# Patient Record
Sex: Female | Born: 1960 | Race: White | Hispanic: No | Marital: Single | Smoking: Former smoker
Health system: Southern US, Community
[De-identification: ages and names within clinical notes are randomized; demographics above are authoritative.]

## PROBLEM LIST (undated history)

## (undated) DIAGNOSIS — M549 Dorsalgia, unspecified: Secondary | ICD-10-CM

## (undated) DIAGNOSIS — Z860100 Personal history of colon polyps, unspecified: Secondary | ICD-10-CM

## (undated) DIAGNOSIS — M7540 Impingement syndrome of unspecified shoulder: Secondary | ICD-10-CM

## (undated) DIAGNOSIS — E785 Hyperlipidemia, unspecified: Secondary | ICD-10-CM

## (undated) DIAGNOSIS — G4700 Insomnia, unspecified: Secondary | ICD-10-CM

## (undated) DIAGNOSIS — L309 Dermatitis, unspecified: Secondary | ICD-10-CM

## (undated) DIAGNOSIS — J449 Chronic obstructive pulmonary disease, unspecified: Secondary | ICD-10-CM

## (undated) DIAGNOSIS — J439 Emphysema, unspecified: Secondary | ICD-10-CM

## (undated) DIAGNOSIS — F909 Attention-deficit hyperactivity disorder, unspecified type: Secondary | ICD-10-CM

## (undated) DIAGNOSIS — F419 Anxiety disorder, unspecified: Secondary | ICD-10-CM

## (undated) DIAGNOSIS — M2550 Pain in unspecified joint: Secondary | ICD-10-CM

## (undated) DIAGNOSIS — T7840XA Allergy, unspecified, initial encounter: Secondary | ICD-10-CM

## (undated) DIAGNOSIS — E559 Vitamin D deficiency, unspecified: Secondary | ICD-10-CM

## (undated) DIAGNOSIS — M1990 Unspecified osteoarthritis, unspecified site: Secondary | ICD-10-CM

## (undated) DIAGNOSIS — K219 Gastro-esophageal reflux disease without esophagitis: Secondary | ICD-10-CM

## (undated) DIAGNOSIS — R079 Chest pain, unspecified: Secondary | ICD-10-CM

## (undated) DIAGNOSIS — F320 Major depressive disorder, single episode, mild: Secondary | ICD-10-CM

## (undated) DIAGNOSIS — K760 Fatty (change of) liver, not elsewhere classified: Secondary | ICD-10-CM

## (undated) DIAGNOSIS — M545 Low back pain, unspecified: Secondary | ICD-10-CM

## (undated) DIAGNOSIS — Z972 Presence of dental prosthetic device (complete) (partial): Secondary | ICD-10-CM

## (undated) DIAGNOSIS — F32A Depression, unspecified: Secondary | ICD-10-CM

## (undated) DIAGNOSIS — L659 Nonscarring hair loss, unspecified: Secondary | ICD-10-CM

## (undated) HISTORY — DX: Anxiety disorder, unspecified: F41.9

## (undated) HISTORY — DX: Attention-deficit hyperactivity disorder, unspecified type: F90.9

## (undated) HISTORY — DX: Insomnia, unspecified: G47.00

## (undated) HISTORY — DX: Chronic obstructive pulmonary disease, unspecified: J44.9

## (undated) HISTORY — DX: Fatty (change of) liver, not elsewhere classified: K76.0

## (undated) HISTORY — DX: Vitamin D deficiency, unspecified: E55.9

## (undated) HISTORY — DX: Nonscarring hair loss, unspecified: L65.9

## (undated) HISTORY — DX: Emphysema, unspecified: J43.9

## (undated) HISTORY — DX: Low back pain: M54.5

## (undated) HISTORY — DX: Gastro-esophageal reflux disease without esophagitis: K21.9

## (undated) HISTORY — DX: Personal history of colon polyps, unspecified: Z86.0100

## (undated) HISTORY — DX: Major depressive disorder, single episode, mild: F32.0

## (undated) HISTORY — DX: Hyperlipidemia, unspecified: E78.5

## (undated) HISTORY — DX: Allergy, unspecified, initial encounter: T78.40XA

## (undated) HISTORY — PX: ABDOMINAL HYSTERECTOMY: SHX81

## (undated) HISTORY — DX: Depression, unspecified: F32.A

## (undated) HISTORY — DX: Dermatitis, unspecified: L30.9

## (undated) HISTORY — DX: Pain in unspecified joint: M25.50

## (undated) HISTORY — DX: Dorsalgia, unspecified: M54.9

## (undated) HISTORY — PX: KNEE ARTHROSCOPY: SUR90

## (undated) HISTORY — PX: JOINT REPLACEMENT: SHX530

## (undated) HISTORY — DX: Personal history of colonic polyps: Z86.010

## (undated) HISTORY — DX: Low back pain, unspecified: M54.50

## (undated) HISTORY — DX: Chest pain, unspecified: R07.9

---

## 2000-05-05 ENCOUNTER — Encounter (INDEPENDENT_AMBULATORY_CARE_PROVIDER_SITE_OTHER): Payer: Self-pay | Admitting: Internal Medicine

## 2004-09-29 ENCOUNTER — Encounter: Admission: RE | Admit: 2004-09-29 | Discharge: 2004-09-29 | Payer: Self-pay | Admitting: Internal Medicine

## 2005-01-14 ENCOUNTER — Ambulatory Visit (HOSPITAL_BASED_OUTPATIENT_CLINIC_OR_DEPARTMENT_OTHER): Admission: RE | Admit: 2005-01-14 | Discharge: 2005-01-14 | Payer: Self-pay | Admitting: Orthopedic Surgery

## 2005-01-14 HISTORY — PX: CARPAL TUNNEL RELEASE: SHX101

## 2006-05-20 ENCOUNTER — Emergency Department (HOSPITAL_COMMUNITY): Admission: EM | Admit: 2006-05-20 | Discharge: 2006-05-20 | Payer: Self-pay | Admitting: Emergency Medicine

## 2006-06-02 ENCOUNTER — Ambulatory Visit: Admission: RE | Admit: 2006-06-02 | Discharge: 2006-06-02 | Payer: Self-pay | Admitting: Gynecologic Oncology

## 2006-06-08 ENCOUNTER — Encounter (INDEPENDENT_AMBULATORY_CARE_PROVIDER_SITE_OTHER): Payer: Self-pay | Admitting: *Deleted

## 2006-06-08 ENCOUNTER — Inpatient Hospital Stay (HOSPITAL_COMMUNITY): Admission: RE | Admit: 2006-06-08 | Discharge: 2006-06-10 | Payer: Self-pay | Admitting: Gynecologic Oncology

## 2006-06-08 HISTORY — PX: APPENDECTOMY: SHX54

## 2006-06-08 HISTORY — PX: EXPLORATORY LAPAROTOMY WITH ABDOMINAL MASS EXCISION: SHX5169

## 2006-06-08 HISTORY — PX: SALPINGOOPHORECTOMY: SHX82

## 2006-07-20 ENCOUNTER — Ambulatory Visit: Admission: RE | Admit: 2006-07-20 | Discharge: 2006-07-20 | Payer: Self-pay | Admitting: Gynecologic Oncology

## 2007-10-19 ENCOUNTER — Ambulatory Visit (HOSPITAL_BASED_OUTPATIENT_CLINIC_OR_DEPARTMENT_OTHER): Admission: RE | Admit: 2007-10-19 | Discharge: 2007-10-20 | Payer: Self-pay | Admitting: Orthopedic Surgery

## 2007-10-19 HISTORY — PX: FULKERSON SLIDE: SHX5018

## 2007-12-15 ENCOUNTER — Encounter: Admission: RE | Admit: 2007-12-15 | Discharge: 2007-12-15 | Payer: Self-pay | Admitting: Obstetrics and Gynecology

## 2008-08-29 ENCOUNTER — Encounter: Admission: RE | Admit: 2008-08-29 | Discharge: 2008-08-29 | Payer: Self-pay | Admitting: Internal Medicine

## 2010-05-28 ENCOUNTER — Ambulatory Visit (HOSPITAL_BASED_OUTPATIENT_CLINIC_OR_DEPARTMENT_OTHER): Admission: RE | Admit: 2010-05-28 | Discharge: 2010-05-28 | Payer: Self-pay | Admitting: Orthopedic Surgery

## 2010-05-28 HISTORY — PX: KNEE ARTHROSCOPY: SUR90

## 2010-08-12 ENCOUNTER — Encounter: Admission: RE | Admit: 2010-08-12 | Discharge: 2010-08-12 | Payer: Self-pay | Admitting: Internal Medicine

## 2010-10-22 ENCOUNTER — Ambulatory Visit: Payer: Self-pay | Admitting: Cardiovascular Disease

## 2010-10-28 ENCOUNTER — Telehealth (INDEPENDENT_AMBULATORY_CARE_PROVIDER_SITE_OTHER): Payer: Self-pay | Admitting: *Deleted

## 2010-10-29 ENCOUNTER — Encounter: Payer: Self-pay | Admitting: Cardiology

## 2010-10-29 ENCOUNTER — Encounter: Payer: Self-pay | Admitting: *Deleted

## 2010-10-29 ENCOUNTER — Ambulatory Visit: Payer: Self-pay

## 2010-10-29 ENCOUNTER — Encounter (HOSPITAL_COMMUNITY)
Admission: RE | Admit: 2010-10-29 | Discharge: 2010-12-16 | Payer: Self-pay | Source: Home / Self Care | Attending: Cardiovascular Disease | Admitting: Cardiovascular Disease

## 2010-11-05 ENCOUNTER — Telehealth (INDEPENDENT_AMBULATORY_CARE_PROVIDER_SITE_OTHER): Payer: Self-pay | Admitting: *Deleted

## 2010-12-18 NOTE — Assessment & Plan Note (Signed)
Summary: Cardiology Nuclear Testing  Nuclear Med Background Indications for Stress Test: Evaluation for Ischemia   History: GXT   Symptoms: Chest Pain, Chest Pain with Exertion, DOE, Palpitations, SOB    Nuclear Pre-Procedure Cardiac Risk Factors: Family History - CAD, Lipids, RBBB, Smoker Caffeine/Decaff Intake: None NPO After: 9:00 PM Lungs: clear IV 0.9% NS with Angio Cath: 22g     IV Site: R Antecubital IV Started by: Bonnita Levan, RN Chest Size (in) 38     Cup Size C     Height (in): 63 Weight (lb): 160 BMI: 28.45  Nuclear Med Study 1 or 2 day study:  1 day     Stress Test Type:  Eugenie Birks Reading MD:  Cassell Clement, MD     Referring MD:  P.Nahser Resting Radionuclide:  Technetium 55m Tetrofosmin     Resting Radionuclide Dose:  11.0 mCi  Stress Radionuclide:  Technetium 64m Tetrofosmin     Stress Radionuclide Dose:  33.0 mCi   Stress Protocol  Max Systolic BP: 122 mm Hg Lexiscan: 0.4 mg   Stress Test Technologist:  Milana Na, EMT-P     Nuclear Technologist:  Domenic Polite, CNMT  Rest Procedure  Myocardial perfusion imaging was performed at rest 45 minutes following the intravenous administration of Technetium 1m Tetrofosmin.  Stress Procedure  The patient received IV Lexiscan 0.4 mg over 15-seconds.  Technetium 51m Tetrofosmin injected at 30-seconds.  There were no significant changes with infusion.  Quantitative spect images were obtained after a 45 minute delay.  QPS Raw Data Images:  Normal; no motion artifact; normal heart/lung ratio. Stress Images:  Normal homogeneous uptake in all areas of the myocardium. Rest Images:  Normal homogeneous uptake in all areas of the myocardium. Subtraction (SDS):  No evidence of ischemia. Transient Ischemic Dilatation:  0.98  (Normal <1.22)  Lung/Heart Ratio:  0.32  (Normal <0.45)  Quantitative Gated Spect Images QGS EDV:  81 ml QGS ESV:  28 ml QGS EF:  65 % QGS cine images:  No wall motion  abnormalities.  Findings Normal nuclear study      Overall Impression  Exercise Capacity: Lexiscan with no exercise. BP Response: Normal blood pressure response. Clinical Symptoms: No chest pain ECG Impression: No significant ST segment change suggestive of ischemia.  RBBB. Overall Impression: Normal stress nuclear study.  Appended Document: Cardiology Nuclear Testing copy sent to Dr. Elease Hashimoto

## 2010-12-18 NOTE — Progress Notes (Signed)
Summary: Nuclear pre procedure  Phone Note Outgoing Call Call back at [facility] Phone [phone]   Call placed by: Rea College, CMA,  October 28, 2010 4:29 PM Call placed to: Patient Summary of Call: Reviewed information on [employer] Information Sheet (see scanned document for further details).  Spoke with patient's roommate.      Nuclear Med Background Indications for Stress Test: Evaluation for Ischemia   History: GXT   Symptoms: Chest Pain, Chest Pain with Exertion    Nuclear Pre-Procedure Cardiac Risk Factors: Family History - CAD, Lipids, Smoker

## 2010-12-18 NOTE — Progress Notes (Signed)
Summary: Records Request  Faxed Stress to [facility] at [facility] ([phone]). Debby Freiberg  November 05, 2010 4:48 PM

## 2011-01-15 ENCOUNTER — Ambulatory Visit: Payer: Self-pay | Admitting: Cardiovascular Disease

## 2011-02-01 LAB — POCT HEMOGLOBIN-HEMACUE: Hemoglobin: 14.6 g/dL (ref 12.0–15.0)

## 2011-03-31 NOTE — Op Note (Signed)
NAMEPSALMS, OLARTE                ACCOUNT NO.:  [id number]   MEDICAL RECORD NO.:  [id number]          PATIENT TYPE:  AMB   LOCATION:  DSC                          FACILITY:  [facility]   PHYSICIAN:  Harvie Junior, M.D.   DATE OF BIRTH:  1961/07/14   DATE OF PROCEDURE:  10/19/2007  DATE OF DISCHARGE:                               OPERATIVE REPORT   PREOPERATIVE DIAGNOSIS:  Significant knee pain status post arthroscopic  debridement of medial-lateral condyles as well as patellofemoral joint.   POSTOPERATIVE DIAGNOSIS:  Significant knee pain status post arthroscopic  debridement of medial-lateral condyles as well as patellofemoral joint.   PRINCIPAL PROCEDURE:  1. Arthroscopic debridement of a medial femoral condyle.  2. Arthroscopic debridement of lateral femoral condyle.  3. Arthroscopic debridement of patellofemoral joint.  4. Arthroscopic lateral retinacular release.  5. Open Fulkerson slide.   SURGEON:  Harvie Junior, M.D.   ASSISTANT:  Marshia Ly, P.A.   ANESTHESIA:  General.   BRIEF HISTORY:  Ms. Rambeau is 50 year old female with a long history of  having had significant right knee pain.  We treated her conservatively  for  period of time, ultimately underwent arthroscopic examination  showing that she had some grade III and IV changes on both her medial  femoral condyle, lateral femoral condyle and significant chondromalacia  of the patellofemoral joint.  We had, because of failure of this  procedure, continued pain.  We had injected her and she got some relief.  We ultimately felt that knee replacement was the most appropriate course  of action for her.  She had been evaluated by Dr. Gean Birchwood my  partner, who felt that a knee replacement was the most appropriate  course of action for her but ultimately, second opinion was sought by  Dr. Chilton Greathouse who felt that patellofemoral surgery may have some  benefit and ultimately, we had a long discussion with her and  she did  wish to undergo this.  We had written a letter to her Worker's  [employer] discussing what we thought would be appropriate need  for a knee replacement but they felt more obligated to proceed with a  patellofemoral surgery and certainly you could not argue with this and  we had discussions with her and she did wish to proceed with this  patellofemoral surgery and we felt that taking some pressure of the  patellofemoral joint was going to be appropriate so we did get her  approved for a Fulkerson slide.  She was brought to the operating room  for this procedure.   PROCEDURE:  The patient was brought to the operating room.  After  adequate anesthesia was obtained with general anesthetic, the patient  was placed supine on the operating room table.  The right leg was  prepped and draped in the usual sterile fashion.  Following this,  routine arthroscopic examination of the patellofemoral joint revealed  there was significant severe grade IV changes almost in the whole back  side of the patella and then certainly laterally in the lateral patellar  facet but also centrally.  This was debrided with an arthroscopic shaver  and we were able to get an excellent debridement of the patellofemoral  joint.   Following this, attention was turned into the medial gutter where there  was probably a 10 x 20 cm area of a grade III and some mild grade IV  changes in the medial femoral condyle.  The medial meniscus was okay.  Attention was turned laterally where there was a large lateral femoral  condylar defect with increased flaking and cracking, making it probably  15 x 25-mm defect.  This was debrided with some grade IV change, grade  III and some grade II out laterally.   Following this, the attention was turned up into the lateral gutter.  The lateral retinacular release was reperformed to free up the patella  completely and so following that, we felt that the patella was freely   mobile.   Attention was turned towards the open portion of the procedure.  Incision was made from the inferior aspect of the patella down distally.  Subcutaneous tissue was taken down to the level of the fascia.  The  fascia was opened medially and laterally.  Angled cut was made at about  a 50 degree angle to the patellar tubercle and medially and then this  was connected laterally.  This was cracked up and moved anteriorly by  about 13 mm and medial by about 1 cm.  Once this was completed, this was  held in place and drilled with three partially threaded cancellous  screws, small fragment set, which got excellent compression and fixation  of the distal patellar tubercle.  Once this was completed, the wound was  copiously and thoroughly irrigated and suctioned dry.  The lateral  muscle fascia was closed with a 1 Vicryl running, the skin with 0 and 2-  0 Vicryl and 3-0 Maxon subcuticular stitching.  A sterile compression  dressing was applied as well as a knee immobilizer and the patient was  taken to the recovery room.  She was noted to be in satisfactory  condition.  Estimated blood loss of the procedure was nothing.      Harvie Junior, M.D.  Electronically Signed     JLG/MEDQ  D:  10/19/2007  T:  10/19/2007  Job:  [id number]   cc:   Harvie Junior, M.D.

## 2011-04-03 NOTE — Consult Note (Signed)
Virginia [name], Virginia [name]                ACCOUNT NO.:  [id number]   MEDICAL RECORD NO.:  [id number]          PATIENT TYPE:  OUT   LOCATION:  GYN                          FACILITY:  [facility]   PHYSICIAN:  John T. Kyla Balzarine, M.D.    DATE OF BIRTH:  06/17/1961   DATE OF CONSULTATION:  07/20/2006  DATE OF DISCHARGE:                                   CONSULTATION   CHIEF COMPLAINT:  Right lower quadrant abdominal pain.   HISTORY:  This patient underwent exploratory laparotomy with resection of  right ovarian mass, left salpingo-oophorectomy, appendectomy, partial  omentectomy and peritoneal biopsies for a right ovarian mass.  Final  pathology revealed a borderline mucinous tumor involving the right ovary.  Appendix was benign.  Her postoperative convalescence was relatively  uncomplicated until she started working, and now she notes right lower  quadrant pain, pulling and swelling without GI symptoms, radiation to the  flank, or edema.  It should be noted that the patient returned to work 5  days postop and is extremely physical at work.  Her pain has been  exacerbated with work-related activities.  She tires easily.   PHYSICAL EXAMINATION:  VITAL SIGNS:  Weight 141 pounds.  LYMPHATIC:  There is no pathologic lymphadenopathy.  BACK:  There is no back or CVA tenderness.  ABDOMEN:  Soft and benign with well-healed incision.  There is no tenderness  along the incision but there is minimal tenderness in the right lower  quadrant.  No peritoneal signs.  PELVIC:  External genitalia and BUS are normal to inspection and palpation.  Bladder and urethra are normal.  The vagina is normal to inspection, without  mucosal lesions.  Bimanual and vaginal examinations disclose no mass or  nodularity, absent uterus and cervix.   ASSESSMENT:  Right lower quadrant pain occurring 6 weeks post exploratory  laparotomy, exacerbated by physical activity at work.   PLAN:  I had a long discussion with the patient, and we  discussed the likely  musculoskeletal etiology for pain.  We will check a CBC today, and she is  instructed to use running ibuprofen supplemented by Vicodin.  If her pain  does not improve over the weekend, we could set up a CT scan at that time.      John T. Kyla Balzarine, M.D.  Electronically Signed     JTS/MEDQ  D:  07/20/2006  T:  07/21/2006  Job:  [id number]   cc:   Miguel Aschoff, M.D.  Fax: [phone]   Telford Nab, R.N.  [address]  [place], [place] [address]

## 2011-04-03 NOTE — Discharge Summary (Signed)
NAMEUVA, RUNKEL                ACCOUNT NO.:  [id number]   MEDICAL RECORD NO.:  [id number]          PATIENT TYPE:  INP   LOCATION:  1604                         FACILITY:  [facility]   PHYSICIAN:  Miguel Aschoff, M.D.       DATE OF BIRTH:  Jul 15, 1961   DATE OF ADMISSION:  06/08/2006  DATE OF DISCHARGE:  06/10/2006                                 DISCHARGE SUMMARY   ADMISSION DIAGNOSIS:  Complex ovarian mass.   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   OPERATIONS AND PROCEDURES:  Exploratory laparotomy, bilateral salpingo-  oophorectomy, omental biopsy, peritoneal biopsy.   BRIEF HISTORY:  The patient is a 50 year old white female, initially  evaluated because of severe lower abdominal pain and found at that time to  have a 16-cm pelvic mass with ascites. The patient was evaluated with CA125  level which was 49, and consultation was obtained with Dr. Ronita Hipps who  felt that exploratory laparotomy for this mass was definitely indicated. The  patient underwent preoperative studies which included initial hemoglobin of  15.4, hematocrit of 45, white count 14,500, platelet count was normal.  Chemistry profile was essentially within normal limits except for mild  elevation of her glucose. On July 23 under general anesthesia, the patient  underwent exploratory laparotomy to evaluate this mass. At that time, she  was found to have a 16- to 17-cm right ovarian tumor which on frozen section  proved to be a borderline mucinous cyst adenoma of the ovary. There was a  small amount of ascites present which was sent for cytology which proved to  be negative for malignant cells. The mass was adherent to the sigmoid colon,  and it was possible to excise this mass in toto without rupturing it, and  final pathology on this tumor is now pending. To complete the surgery, the  portion of the omentum was excised and also sent for histologic study as  well as the appendix. The  patient's postoperative course was essentially  uncomplicated. She tolerated increased ambulation and diet well and by July  26 was in satisfactory condition to be discharged home. The patient will be  discharged home on Tylox 1 every 3 hours as needed for pain and Motrin 600  mg p.o. q.6h. p.r.n. pain. She was instructed no heavy lifting, place  nothing in the vagina, to do no driving for 2 weeks. She can go up stairs  but needs to take them slowly. She is to return to see Dr. Tenny Craw on August 3  for removal of staples.      Miguel Aschoff, M.D.  Electronically Signed     AR/MEDQ  D:  06/10/2006  T:  06/10/2006  Job:  [id number]

## 2011-04-07 NOTE — Op Note (Signed)
Virginia [name], Virginia [name]                ACCOUNT NO.:  [id number]   MEDICAL RECORD NO.:  [id number]          PATIENT TYPE:  AMB   LOCATION:  DSC                          FACILITY:  [facility]   PHYSICIAN:  Artist Pais. Weingold, M.D.DATE OF BIRTH:  May 26, 1961   DATE OF PROCEDURE:  01/14/2005  DATE OF DISCHARGE:                                 OPERATIVE REPORT   PREOPERATIVE DIAGNOSIS:  Left cubital tunnel syndrome, left carpal tunnel  syndrome and left medial epicondylitis.   POSTOPERATIVE DIAGNOSIS:  Left cubital tunnel syndrome, left carpal tunnel  syndrome and left medial epicondylitis.   PROCEDURES:  1.  Left carpal tunnel release.  2.  Left cubital tunnel release.  3.  Injection of left medial epicondyle.   SURGEON:  Artist Pais. Mina Marble, M.D.   ASSISTANT:  Aura Fey. Bobbe Medico.   ANESTHESIA:  General anesthesia.   TOURNIQUET TIME:  40 minutes.   COMPLICATIONS:  None.   DRAINS:  None.   DESCRIPTION OF PROCEDURE:  Patient was taken to the operating room after the  induction of adequate general anesthesia, left upper extremity was prepped  and draped in usual sterile fashion.  An Esmarch was used to exsanguinate  the limb.  Tourniquet was inflated with 250 mmHg.  At this point in time, a  2 cm incision in the palmar aspect of the left hand in line with the long  finger metacarpal starting at Captain's cardinal line.  The incision was  taken down through skin and subcutaneous tissues.  Palmar fascia was  identified and split.  The distal edge of the transverse carpal ligament was  identified.  It was split with a 15 blade.  The median nerve was protected  with the [facility] and remaining aspects of the transverse carpal  ligament were divided under direct vision using a curved blunt scissors.  Canal was inspected.  There were no osseus lesions or ganglions present.  This was irrigated and loosely closed with a running 3-0 Prolene  subcuticular stitch.  The second incision was  made on the medial aspect of  the left elbow extending between the medial epicondyle and olecranon process  going 2.5 to 3 cm in either direction.  Skin was incised.  Flaps were raised  carefully while protecting the medial antral brachial cutaneous nerve  branches.  The ulnar nerve was identified just proximal to the cubital  tunnel.  The cubital tunnel was then roofed.  Ulnar nerve was decompressed  distally through the fascia overlying the flexor carpi ulnaris muscles.  All  pressure points were relieved.  Proximally it was decompressed under a skin  bridge, including release of the intramuscular septum.  At this point in  time, a combination of 40 of Kenalog and 0.25 Marcaine plain 1 mL of each  was injected  directly into the medial epicondylar area.  This wound was also thoroughly  irrigated and this was closed with a running 3-0 Prolene subcuticular  stitch.  Steri-Strips, 4x4s, fluffs, compression dressing was applied at the  elbow and wrist.  The patient tolerated the procedure well and went to  the  recovery room in stable fashion.      MAW/MEDQ  D:  01/14/2005  T:  01/14/2005  Job:  [id number]

## 2011-04-07 NOTE — Op Note (Signed)
Virginia [name], Virginia [name]                ACCOUNT NO.:  [id number]   MEDICAL RECORD NO.:  [id number]          PATIENT TYPE:  INP   LOCATION:  0010                         FACILITY:  [facility]   PHYSICIAN:  John T. Kyla Balzarine, M.D.    DATE OF BIRTH:  [birth year]   DATE OF PROCEDURE:  06/08/2006  DATE OF DISCHARGE:                                 OPERATIVE REPORT   SURGEON:  Ronita Hipps, M.D.   ASSISTANT:  Dr. Miguel Aschoff.   PREOPERATIVE DIAGNOSIS:  Pelvic mass.   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   PROCEDURE:  Exploratory lap, resection right ovarian mass, left salpingo-  oophorectomy, partial omentectomy, peritoneal biopsies and appendectomy.   ANESTHESIA:  General endotracheal.   PATHOLOGY SPECIMENS:  Bilateral tubes and ovaries, ileal mesentery biopsy,  omentum, washings.   ESTIMATED BLOOD LOSS:  150 mL.   TRANSFUSIONS:  None.   INDICATIONS FOR SURGERY AND OPERATIVE FINDINGS:  This 50 year old had  previously undergone a hysterectomy.  She presented with a large  abdominopelvic mass and CA-125 value of 49.  Examination under anesthesia  confirmed approximately a 20 weeks size mass arising from the pelvis and  filling the posterior cul-de-sac, smooth and cystic but poorly mobile.  Upon  entry into the abdomen, there was a small amount of free peritoneal fluid,  estimated proximally 200 mL.  The dominant mass replaced the right ovary.  It was smooth-walled cyst.  Anteriorly, multiple appendix epiploica were  adherent to the mass.  After freeing adhesions, the mass was submitted for  frozen section and subsequently returned positive for noninvasive mucinous  LMP.  The appendix was normal but because of mucinous histology,  appendectomy was performed.  There was reactive peritoneal surface in the  mesentery of the terminal ileum, resected, and oblique likewise reactive  surface of the dependent omentum which was resected for staging.  Left tube  and ovary were encased in adhesions to the sigmoid colon but grossly normal.  No palpable or visible tumor at conclusion of the procedure.   DESCRIPTION OF PROCEDURE:  The patient was prepped and draped in the low  lithotomy position with an indwelling Foley catheter after examination under  anesthesia revealed findings described above.  Exploration was carried out  through a midline incision which extended to the left of the umbilicus into  the epigastrium, developed with scalpel and electrocautery for hemostasis.  Upon entering the abdomen, above findings were noted.  Free peritoneal fluid  was collected and submitted for cytology.  Bowel was packed out of the  pelvis and hand held retractors used for the case.  Adhesions between the  sigmoid colon and right pelvic mass were developed and the pedicles which  were clamped, divided and free tied with 0 Vicryl were cauterized with  electrocautery.  After freeing these anterior adhesions, the mass lifted out  of the posterior pelvis and was not adherent to pelvic sidewall.  The right  infundibulopelvic ligament was skeletonized high above the ureter,  crossclamped, divided and ligated with free tie and suture ligature of 2-0  Vicryl.  The adherent sidewall peritoneum was resected by opening the  peritoneum lateral to the fallopian tube down to the level of the round  ligament, partially developing the pararectal space and incising the medial  leaf of broad ligaments in the region of the uterine artery, the  electrocautery was used to separate the mass from its attachments.  Additional hemostasis was achieved with suture ligature of 2-0 Vicryl in  this region.  Mass was submitted for frozen section.  Adhesions between the  sigmoid colon and the left pelvic sidewall were taken down sharp and blunt  dissection electrocautery for hemostasis.  The left ovary and tube were  identified and mobilized medially.  The broad ligament was opened lateral  to  the infundibulopelvic ligament and tube and the IP ligament was skeletonized  away from the ureter, crossclamped, divided and ligated with free tie and  suture ligature of 2-0 Vicryl.  At this juncture, frozen section returned  positive for noninvasive LMP of mucinous histology.  The upper abdomen was  meticulously explored with visual and manual palpation.  A small patch of  reactive mesentery of the terminal ileum was resected using electrocautery  and sharp dissection to left the peritoneal surface.  Partial omentectomy  was performed, mobilizing the dependent omentum off the transverse colon,  and developing pedicles which were controlled with electrocautery.  There  were no grossly visible or palpable tumor nodules in the upper abdomen and  the appendix looked normal.  However, because of mucinous histology,  appendectomy was elected.  Mesentery of the appendix was divided with  electrocautery for hemostasis.  The base the appendix was crushed,  crossclamped, divided and suture ligated with 2-0 Vicryl.  Abdomen and  pelvis were copiously irrigated, sponges and retractors removed and  abdominal wall closed in layers.  Rectus muscles and fascia were closed with  a mass running closure of 0 PDS.  The subcutaneous adipose tissue was  irrigated and additional hemostasis achieved with electrocautery.  Skin was  closed with skin clips.  The patient was returned to recovery room in stable  condition after emergence from anesthesia.   PATHOLOGY SPECIMEN:  Bilateral tubes and ovaries, omentum, peritoneal biopsy  from the ileal mesentery, appendix, peritoneal washings.   ESTIMATED BLOOD LOSS:  150 mL.   TRANSFUSIONS:  None.   DRAINS AND PACKS:  Foley to dependent drainage.   SPONGE AND INSTRUMENT COUNTS:  Correct x2.      John T. Kyla Balzarine, M.D.  Electronically Signed     JTS/MEDQ  D:  06/08/2006  T:  06/08/2006  Job:  [id number]   cc:   Miguel Aschoff, M.D. Fax: [phone]   Telford Nab, R.N.  [address]  [place], [place] [address]

## 2011-04-07 NOTE — Consult Note (Signed)
NAMEMERRITT, Virginia [name]                ACCOUNT NO.:  [id number]   MEDICAL RECORD NO.:  [id number]          PATIENT TYPE:  OUT   LOCATION:  GYN                          FACILITY:  [facility]   PHYSICIAN:  John T. Kyla Balzarine, M.D.    DATE OF BIRTH:  02-Dec-1960   DATE OF CONSULTATION:  DATE OF DISCHARGE:                                   CONSULTATION   CHIEF COMPLAINT:  This 50 year old nulligravid woman is seen at the request  of Dr. Tenny Craw for recommendations regarding management of abdominopelvic pain  associated with a large cystic mass.   HISTORY OF PRESENT ILLNESS:  The patient relates low-grade right lower  quadrant abdominal pressure/pain over the past 2 months.  She has associated  early satiety, increasing urinary urgency, incontinence and tenesmus.  She  has had an increase in symptoms over the past month and 3 severe  exacerbations with pain increasing from baseline 3-4 up to a 10, crampy in  nature and lasting for 8+ hours.  Pain does radiate into the back and the  right upper quadrant.  Over the past week, she has noted a component of left  lower abdominal pain.  She was seen in the emergency room at [facility] on  May 20, 2006 and ultrasound revealed a 16 x 12 x 20-cm cystic right adnexal  mass, a 2.3-cm complex cyst of the left ovary and free fluid.  CA125 value  was 49.0.  The patient is using one to two Vicodin every 6-8 hours for pain  control.   PAST MEDICAL HISTORY:  Significant for chronic low-grade depression/anxiety,  restless leg syndrome, but no other major comorbidities.  She is status post  TAH in 1996 for dysfunctional uterine bleeding; per her report, no evidence  of endometriosis.  She is status post arthroscopic knee surgery bilaterally  and is a nulligravida.   MEDICATIONS:  1.  Prozac 20 mg daily for depression.  2.  Valium p.r.n. for restless legs.   ALLERGIES:  PENICILLIN.   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  She is a smoker and admits to occasional social ethanol.   FAMILY HISTORY:  A second cousin had ovarian cancer, but no other known  gynecologic, breast or colon malignancies.   REVIEW OF SYSTEMS:  Early satiety, increasing lower abdominal girth,  tenesmus and urgency incontinence as noted above and associated with lower  abdominopelvic pain.  Otherwise negative in the remainder of 10  comprehensive systems.   EXAM:  VITAL SIGNS:  Weight 156 pounds, blood pressure 118/78, pulse 92,  temperature -- afebrile, respirations 18.  GENERAL:  The patient is anxious, alert and oriented x3, in no acute  distress.  ENT:  Benign with clear oropharynx, no scleral icterus and full extraocular  movements.  Neck:  Supple without goiter.  LUNGS:  Lung fields are clear.  HEART:  Heart sounds -- regular rate and rhythm without murmur, gallop or  JVD.  LYMPH NODE SURVEY:  No pathologic lymphadenopathy.  BACK:  There is no back or CVA tenderness.  ABDOMEN:  Soft and benign with well-healed Pfannenstiel incision.  And there  is a mass effect arising from the right false pelvis, crossing the midline  and extending to approximately 2 fingerbreadths below the umbilicus.  This  is minimally tender.  There is no fluid wave or gross ascites or  organomegaly noted.  EXTREMITIES:  Full strength and range of motion without edema.  SKIN:  No suspicious lesions.  NEUROLOGIC:  Screen intact.  PELVIC:  External genitalia and BUS are normal to inspection and palpation.  Bladder and urethra are normal with no mucosal lesions of the vagina.  On  bimanual and rectovaginal examinations, a cystic mass extends down the  rectovaginal septum with no nodularity.  This is immobile and slightly  tender, contiguous with the 18-cm right lower abdominopelvic mass.  There is  no penetration through rectal mucosa on rectal examination.   LABORATORY DATA:  CA125 value 49.0.   Ultrasound obtained at [facility] on May 20, 2006 is reviewed, confirming 16  x 12 x 20-cm cystic right adnexal mass with a 2.3-cm complex cyst of the  left ovary and a modest amount of free fluid.   ASSESSMENT:  Large adnexal cysts associated with pain, free fluid and  elevated CA125; differential diagnosis includes torsion, a benign cyst with  leakage, and ovarian malignancy.   PLAN AND RECOMMENDATIONS:  I had a long discussion with the patient  regarding differential diagnosis and management.  I recommended an  exploratory laparotomy through a midline incision with removal of the mass,  performance of frozen section and removal of the contralateral ovary.  I  discussed in detail debulking or staging if an ovarian malignancy were  encountered.  We discussed the possibility of colonic resection.  Risks  including infection, bleeding, thromboembolic complications and damage to  adjacent organs were discussed, questions answered and the patient agrees  with this plan.  Surgery is scheduled for June 08, 2006 and Dr. Tenny Craw will be  assisting and performs surgery.      John T. Kyla Balzarine, M.D.  Electronically Signed     JTS/MEDQ  D:  06/02/2006  T:  06/02/2006  Job:  [id number]   cc:   Miguel Aschoff, M.D.  Fax: [phone]   Telford Nab, R.N.  [address]  [place], [place] [address]

## 2011-04-30 ENCOUNTER — Encounter (HOSPITAL_COMMUNITY)
Admission: RE | Admit: 2011-04-30 | Discharge: 2011-04-30 | Disposition: A | Discharge status: Home / Self Care | Payer: BC Managed Care – PPO | Source: Ambulatory Visit | Attending: Orthopedic Surgery | Admitting: Orthopedic Surgery

## 2011-04-30 LAB — URINALYSIS, ROUTINE W REFLEX MICROSCOPIC
Bilirubin Urine: NEGATIVE
Glucose, UA: NEGATIVE mg/dL
Hgb urine dipstick: NEGATIVE
Ketones, ur: NEGATIVE mg/dL
Leukocytes, UA: NEGATIVE
Nitrite: NEGATIVE
Protein, ur: NEGATIVE mg/dL
Specific Gravity, Urine: 1.016 (ref 1.005–1.030)
Urobilinogen, UA: 0.2 mg/dL (ref 0.0–1.0)
pH: 5.5 (ref 5.0–8.0)

## 2011-04-30 LAB — COMPREHENSIVE METABOLIC PANEL
ALT: 15 U/L (ref 0–35)
AST: 16 U/L (ref 0–37)
Albumin: 4 g/dL (ref 3.5–5.2)
Alkaline Phosphatase: 94 U/L (ref 39–117)
BUN: 13 mg/dL (ref 6–23)
CO2: 29 mEq/L (ref 19–32)
Calcium: 9.7 mg/dL (ref 8.4–10.5)
Chloride: 103 mEq/L (ref 96–112)
Creatinine, Ser: 0.78 mg/dL (ref 0.4–1.2)
GFR calc Af Amer: >60 mL/min (ref >60)
GFR calc non Af Amer: >60 mL/min (ref >60)
Glucose, Bld: 92 mg/dL (ref 70–99)
Potassium: 4.2 mEq/L (ref 3.5–5.1)
Sodium: 141 mEq/L (ref 135–145)
Total Bilirubin: 0.3 mg/dL (ref 0.3–1.2)
Total Protein: 6.9 g/dL (ref 6.0–8.3)

## 2011-04-30 LAB — DIFFERENTIAL
Basophils Absolute: 0.1 10*3/uL (ref 0.0–0.1)
Basophils Relative: 0 % (ref 0–1)
Eosinophils Absolute: 0 10*3/uL (ref 0.0–0.7)
Eosinophils Relative: 0 % (ref 0–5)
Lymphocytes Relative: 35 % (ref 12–46)
Lymphs Abs: 4 10*3/uL (ref 0.7–4.0)
Monocytes Absolute: 0.6 10*3/uL (ref 0.1–1.0)
Monocytes Relative: 5 % (ref 3–12)
Neutro Abs: 6.8 10*3/uL (ref 1.7–7.7)
Neutrophils Relative %: 59 % (ref 43–77)

## 2011-04-30 LAB — CBC
HCT: 43.7 % (ref 36.0–46.0)
Hemoglobin: 15 g/dL (ref 12.0–15.0)
MCH: 33.1 pg (ref 26.0–34.0)
MCHC: 34.3 g/dL (ref 30.0–36.0)
MCV: 96.5 fL (ref 78.0–100.0)
Platelets: 251 10*3/uL (ref 150–400)
RBC: 4.53 MIL/uL (ref 3.87–5.11)
RDW: 12.8 % (ref 11.5–15.5)
WBC: 11.5 10*3/uL — ABNORMAL HIGH (ref 4.0–10.5)

## 2011-04-30 LAB — TYPE AND SCREEN
ABO/RH(D): O POS
Antibody Screen: NEGATIVE

## 2011-04-30 LAB — APTT: aPTT: 30 seconds (ref 24–37)

## 2011-04-30 LAB — SURGICAL PCR SCREEN
MRSA, PCR: NEGATIVE
Staphylococcus aureus: POSITIVE — AB

## 2011-04-30 LAB — ABO/RH: ABO/RH(D): O POS

## 2011-04-30 LAB — PROTIME-INR
INR: 0.9 (ref 0.00–1.49)
Prothrombin Time: 12.3 seconds (ref 11.6–15.2)

## 2011-05-04 ENCOUNTER — Inpatient Hospital Stay (HOSPITAL_COMMUNITY)
Admission: RE | Admit: 2011-05-04 | Discharge: 2011-05-06 | DRG: 209 | Disposition: A | Discharge status: Home Health | Payer: BC Managed Care – PPO | Source: Ambulatory Visit | Attending: Orthopedic Surgery | Admitting: Orthopedic Surgery

## 2011-05-04 DIAGNOSIS — F17200 Nicotine dependence, unspecified, uncomplicated: Secondary | ICD-10-CM | POA: Diagnosis present

## 2011-05-04 DIAGNOSIS — F3289 Other specified depressive episodes: Secondary | ICD-10-CM | POA: Diagnosis present

## 2011-05-04 DIAGNOSIS — Z880 Allergy status to penicillin: Secondary | ICD-10-CM

## 2011-05-04 DIAGNOSIS — F329 Major depressive disorder, single episode, unspecified: Secondary | ICD-10-CM | POA: Diagnosis present

## 2011-05-04 DIAGNOSIS — M1710 Unilateral primary osteoarthritis, unspecified knee: Principal | ICD-10-CM | POA: Diagnosis present

## 2011-05-04 HISTORY — PX: TOTAL KNEE ARTHROPLASTY: SHX125

## 2011-05-05 LAB — BASIC METABOLIC PANEL
BUN: 7 mg/dL (ref 6–23)
CO2: 31 mEq/L (ref 19–32)
Calcium: 8.5 mg/dL (ref 8.4–10.5)
Chloride: 103 mEq/L (ref 96–112)
Creatinine, Ser: 0.66 mg/dL (ref 0.50–1.10)
GFR calc Af Amer: >60 mL/min (ref >60)
GFR calc non Af Amer: >60 mL/min (ref >60)
Glucose, Bld: 119 mg/dL — ABNORMAL HIGH (ref 70–99)
Potassium: 3.8 mEq/L (ref 3.5–5.1)
Sodium: 139 mEq/L (ref 135–145)

## 2011-05-05 LAB — PROTIME-INR
INR: 1.02 (ref 0.00–1.49)
Prothrombin Time: 13.6 seconds (ref 11.6–15.2)

## 2011-05-05 LAB — CBC
HCT: 32.5 % — ABNORMAL LOW (ref 36.0–46.0)
Hemoglobin: 10.8 g/dL — ABNORMAL LOW (ref 12.0–15.0)
MCH: 32.4 pg (ref 26.0–34.0)
MCHC: 33.2 g/dL (ref 30.0–36.0)
MCV: 97.6 fL (ref 78.0–100.0)
Platelets: 205 10*3/uL (ref 150–400)
RBC: 3.33 MIL/uL — ABNORMAL LOW (ref 3.87–5.11)
RDW: 12.9 % (ref 11.5–15.5)
WBC: 14.2 10*3/uL — ABNORMAL HIGH (ref 4.0–10.5)

## 2011-05-06 LAB — CBC
HCT: 32 % — ABNORMAL LOW (ref 36.0–46.0)
Hemoglobin: 10.6 g/dL — ABNORMAL LOW (ref 12.0–15.0)
MCH: 32.3 pg (ref 26.0–34.0)
MCHC: 33.1 g/dL (ref 30.0–36.0)
MCV: 97.6 fL (ref 78.0–100.0)
Platelets: 204 10*3/uL (ref 150–400)
RBC: 3.28 MIL/uL — ABNORMAL LOW (ref 3.87–5.11)
RDW: 13.1 % (ref 11.5–15.5)
WBC: 12 10*3/uL — ABNORMAL HIGH (ref 4.0–10.5)

## 2011-05-06 LAB — BASIC METABOLIC PANEL
BUN: 7 mg/dL (ref 6–23)
CO2: 30 mEq/L (ref 19–32)
Calcium: 8.5 mg/dL (ref 8.4–10.5)
Chloride: 105 mEq/L (ref 96–112)
Creatinine, Ser: 0.57 mg/dL (ref 0.50–1.10)
GFR calc Af Amer: >60 mL/min (ref >60)
GFR calc non Af Amer: >60 mL/min (ref >60)
Glucose, Bld: 110 mg/dL — ABNORMAL HIGH (ref 70–99)
Potassium: 3.8 mEq/L (ref 3.5–5.1)
Sodium: 139 mEq/L (ref 135–145)

## 2011-05-06 LAB — PROTIME-INR
INR: 2.06 — ABNORMAL HIGH (ref 0.00–1.49)
Prothrombin Time: 23.6 seconds — ABNORMAL HIGH (ref 11.6–15.2)

## 2011-05-09 NOTE — Op Note (Signed)
[name], [name]                ACCOUNT NO.:  [id number]  MEDICAL RECORD NO.:  [id number]  LOCATION:  5003                         FACILITY:  [facility]  PHYSICIAN:  Harvie Junior, M.D.   DATE OF BIRTH:  09-May-1961  DATE OF PROCEDURE:  05/04/2011 DATE OF DISCHARGE:                              OPERATIVE REPORT   PREOPERATIVE DIAGNOSIS:  End-stage joint disease, left knee.  POSTOPERATIVE DIAGNOSIS:  End-stage joint disease, left knee.  PROCEDURES: 1. Left total knee replacement with Sigma system, size 3 femur, size 3     tibia, 15-mm bridging bearing, and a 35-mm all-polyethylene     patella. 2. Computer-assisted left total knee replacement.  SURGEON:  Harvie Junior, MD  ASSISTANT:  None.  ANESTHESIA:  General.  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  We treated with arthroscopy, activity modification, and injection therapy.  She failed all of this and because of failure of all conservative care the patient is ultimately taken to the operating room for left total knee replacement.  Because of the patient's young age and need for perfect neutral long alignment, the patient was chosen to use computer assistance preoperatively.  She is brought to the operating room for this procedure.  PROCEDURE:  The patient was taken to the operating room.  After adequate level of anesthesia was obtained under general anesthetic, the patient was placed supine on the operating table.  The left leg was prepped and draped in the usual sterile fashion.  Following this, the leg was exsanguinated.  Blood pressure tourniquet was inflated to 350 mmHg. Following this, a midline incision was made.  Subcutaneous tissue was taken down the level of the extensor mechanism and medial parapatellar arthrotomy was undertaken.  Once this was done, the anterior and posterior cruciates were excised as  well as retropatellar fat pad.  The medial and lateral meniscus were removed as well as the synovium on the anterior aspect of the femur.  Once this was completed, attention was then turned to the placement of computer modules, two pins in the tibia, two pins in the femur and the arrays were placed.  Once this was completed, attention was turned towards the tibia.  Once registration process was under taken that about 30 minutes of the surgical procedure, attention was turned to the tibia.  It was cut perpendicular to its long axis.  Femur was then cut perpendicular to the anatomic axis and once this was completed, the femur was sized to a size 3 and the anterior and posterior cuts were made as well as the chamfer cuts.  Once this was completed, the box cut was made.  Once this was done, attention was turned to the femur which was sized to a 3.  It was drilled and keeled and then the size 3 tibial trial was placed.  Size 3 femoral trial was placed.  Over the 12.5 poly, I then ultimately went with a 15, so a little bit tight in extension, but it definitely needed this in flexion and the mid range needed the 15.  At  this point, attention was turned to the femur where it was sized to a 35 and the lugs were drilled.  35 trial was placed and knee put through a range of motion.  Computer showed perfect neutral long alignment.  Gap balances were symmetric and full extension with a 15 poly.  At this point, the trial components were all removed.  The knee was copiously and thoroughly lavaged with normal saline irrigation via pulsatile lavage.  Once that was completed, the final components were cemented into place, size 3 femur, size 3 tibia. A 50-mm bridging bearing trial was placed with a blue peg and cement was allowed to harden.  The poly patella was placed with a clamp.  Once the cement was allowed to harden, all excess bone cement was removed. Attention was turned towards the tourniquet being  let down.  All bleeders were controlled with electrocautery.  The final 15 poly was placed.  Excellent full extension.  The computer modules were pulled at this point.  Once this was completed, the knee was put through a range of motion and felt that perfect stability and gap balance.  The medial parapatellar arthrotomy was closed with #1 Vicryl running.  After a medium Hemovac drain was placed and once this was completed, attention was turned towards closure of the skin that was closed with 0 and 2-0 Vicryl, 3-0 Monocryl subcuticular.  Benzoin and Steri-Strips were applied.  Sterile compressive dressing was applied as well as knee immobilizer.  The patient was taken to the recovery room where she was noted to be in satisfactory condition.  Estimated blood loss of the procedure was less than 100 mL.     Harvie Junior, M.D.     Ranae Plumber  D:  05/04/2011  T:  05/05/2011  Job:  [id number]  Electronically Signed by Jodi Geralds M.D. on 05/09/2011 12:21:36 AM

## 2011-07-24 ENCOUNTER — Other Ambulatory Visit: Payer: Self-pay | Admitting: Obstetrics and Gynecology

## 2011-08-24 LAB — POCT HEMOGLOBIN-HEMACUE
Hemoglobin: 15.7 — ABNORMAL HIGH
Operator id: 112821

## 2012-01-19 ENCOUNTER — Ambulatory Visit: Payer: BC Managed Care – PPO

## 2012-01-19 ENCOUNTER — Ambulatory Visit (INDEPENDENT_AMBULATORY_CARE_PROVIDER_SITE_OTHER): Payer: BC Managed Care – PPO | Admitting: Family Medicine

## 2012-01-19 VITALS — BP 126/70 | HR 72 | Temp 98.2°F | Resp 16 | Ht 63.5 in | Wt 157.0 lb

## 2012-01-19 DIAGNOSIS — M542 Cervicalgia: Secondary | ICD-10-CM

## 2012-01-19 MED ORDER — NAPROXEN 500 MG PO TABS: 500.0000 mg | ORAL_TABLET | Freq: Two times a day (BID) | ORAL | Status: DC

## 2012-01-19 MED ORDER — CYCLOBENZAPRINE HCL 5 MG PO TABS: 5.0000 mg | ORAL_TABLET | Freq: Three times a day (TID) | ORAL | Status: AC | PRN

## 2012-01-19 NOTE — Progress Notes (Signed)
  Subjective:    Patient ID: Virginia [name], female    DOB: 12-10-1960, 52 y.o.   MRN: [id number]  HPI 51 yo female here with neck and shoulder pain.  Left shoulder - Injured multiple times in the past.  When cooking she re-aggravated the injury.  Pops and grinds.  Can move it around normally.  Only bothers her with intense activity.  Hurt it 3 months ago.   Feeling better but wanted to see if it could be related to neck.  Neck - 2 weeks.  Started during the day.  NKI.  Does heavy lifting at work but can't pinpoint any one event.  Hurts down center, just below "big bump".  No radiation.  Occasional bilateral hand numbness.  Hurts most of the time.  Worse with certain movement, better with putting chin to chest - feels like it is stretching and feels good. Tried tylenol, ibuprofen - sometimes helpful.    Review of Systems Negative except as per HPI     Objective:   Physical Exam  Constitutional: She appears well-developed.  Pulmonary/Chest: Effort normal.  Musculoskeletal:       Cervical back: She exhibits tenderness and bony tenderness. She exhibits normal range of motion, no swelling, no edema and no deformity.  mild TTP is over spine UNDER C7.  No pain to palp of c-spine.  TTP paraspinals of upper thoracic area as well.     [facility] Primary radiology reading by Dr. Georgiana Shore: Negative     Assessment & Plan:  Neck/Back pain - xray negative.  Try ice, naproxen 500, and flexeril. Shoulder pain - improving.  Same things will likely help as well.

## 2012-03-31 ENCOUNTER — Encounter: Payer: Self-pay | Admitting: *Deleted

## 2012-06-16 DIAGNOSIS — M7540 Impingement syndrome of unspecified shoulder: Secondary | ICD-10-CM

## 2012-06-16 DIAGNOSIS — M25819 Other specified joint disorders, unspecified shoulder: Secondary | ICD-10-CM

## 2012-06-16 HISTORY — DX: Other specified joint disorders, unspecified shoulder: M25.819

## 2012-06-16 HISTORY — DX: Impingement syndrome of unspecified shoulder: M75.40

## 2012-07-07 ENCOUNTER — Other Ambulatory Visit: Payer: Self-pay | Admitting: Orthopedic Surgery

## 2012-07-14 ENCOUNTER — Encounter (HOSPITAL_BASED_OUTPATIENT_CLINIC_OR_DEPARTMENT_OTHER): Payer: Self-pay | Admitting: *Deleted

## 2012-07-20 ENCOUNTER — Encounter (HOSPITAL_BASED_OUTPATIENT_CLINIC_OR_DEPARTMENT_OTHER)
Admission: RE | Disposition: A | Discharge status: Home / Self Care | Payer: Self-pay | Source: Ambulatory Visit | Attending: Orthopedic Surgery

## 2012-07-20 ENCOUNTER — Ambulatory Visit (HOSPITAL_BASED_OUTPATIENT_CLINIC_OR_DEPARTMENT_OTHER): Payer: BC Managed Care – PPO | Admitting: *Deleted

## 2012-07-20 ENCOUNTER — Encounter (HOSPITAL_BASED_OUTPATIENT_CLINIC_OR_DEPARTMENT_OTHER): Payer: Self-pay | Admitting: *Deleted

## 2012-07-20 ENCOUNTER — Ambulatory Visit (HOSPITAL_BASED_OUTPATIENT_CLINIC_OR_DEPARTMENT_OTHER)
Admission: RE | Admit: 2012-07-20 | Discharge: 2012-07-20 | Disposition: A | Discharge status: Home / Self Care | Payer: BC Managed Care – PPO | Source: Ambulatory Visit | Attending: Orthopedic Surgery | Admitting: Orthopedic Surgery

## 2012-07-20 DIAGNOSIS — M67919 Unspecified disorder of synovium and tendon, unspecified shoulder: Secondary | ICD-10-CM | POA: Insufficient documentation

## 2012-07-20 DIAGNOSIS — Z9089 Acquired absence of other organs: Secondary | ICD-10-CM | POA: Insufficient documentation

## 2012-07-20 DIAGNOSIS — Z90710 Acquired absence of both cervix and uterus: Secondary | ICD-10-CM | POA: Insufficient documentation

## 2012-07-20 DIAGNOSIS — E785 Hyperlipidemia, unspecified: Secondary | ICD-10-CM | POA: Insufficient documentation

## 2012-07-20 DIAGNOSIS — M19019 Primary osteoarthritis, unspecified shoulder: Secondary | ICD-10-CM | POA: Insufficient documentation

## 2012-07-20 DIAGNOSIS — Z880 Allergy status to penicillin: Secondary | ICD-10-CM | POA: Insufficient documentation

## 2012-07-20 DIAGNOSIS — M249 Joint derangement, unspecified: Secondary | ICD-10-CM | POA: Insufficient documentation

## 2012-07-20 DIAGNOSIS — M719 Bursopathy, unspecified: Secondary | ICD-10-CM | POA: Insufficient documentation

## 2012-07-20 DIAGNOSIS — M25819 Other specified joint disorders, unspecified shoulder: Secondary | ICD-10-CM | POA: Insufficient documentation

## 2012-07-20 DIAGNOSIS — M129 Arthropathy, unspecified: Secondary | ICD-10-CM | POA: Insufficient documentation

## 2012-07-20 HISTORY — DX: Presence of dental prosthetic device (complete) (partial): Z97.2

## 2012-07-20 HISTORY — DX: Impingement syndrome of unspecified shoulder: M75.40

## 2012-07-20 HISTORY — DX: Unspecified osteoarthritis, unspecified site: M19.90

## 2012-07-20 LAB — POCT HEMOGLOBIN-HEMACUE: Hemoglobin: 13.6 g/dL (ref 12.0–15.0)

## 2012-07-20 SURGERY — SHOULDER ARTHROSCOPY WITH OPEN ROTATOR CUFF REPAIR AND DISTAL CLAVICLE ACROMINECTOMY
Anesthesia: General | Site: Shoulder | Laterality: Left | Wound class: Clean

## 2012-07-20 MED ORDER — POVIDONE-IODINE 7.5 % EX SOLN: Freq: Once | CUTANEOUS | Status: DC

## 2012-07-20 MED ORDER — SUCCINYLCHOLINE CHLORIDE 20 MG/ML IJ SOLN
INTRAMUSCULAR | Status: DC | PRN
  Administered 2012-07-20: 100 mg via INTRAVENOUS

## 2012-07-20 MED ORDER — ONDANSETRON HCL 4 MG/2ML IJ SOLN
INTRAMUSCULAR | Status: DC | PRN
  Administered 2012-07-20: 4 mg via INTRAVENOUS

## 2012-07-20 MED ORDER — CLINDAMYCIN PHOSPHATE 300 MG/50ML IV SOLN: 300.0000 mg | Freq: Once | INTRAVENOUS | Status: DC

## 2012-07-20 MED ORDER — CLINDAMYCIN PHOSPHATE 900 MG/50ML IV SOLN
900.0000 mg | INTRAVENOUS | Status: AC
  Administered 2012-07-20: 900 mg via INTRAVENOUS

## 2012-07-20 MED ORDER — PROPOFOL 10 MG/ML IV BOLUS
INTRAVENOUS | Status: DC | PRN
  Administered 2012-07-20: 250 mg via INTRAVENOUS

## 2012-07-20 MED ORDER — CEFAZOLIN SODIUM 1-5 GM-% IV SOLN: 1.0000 g | Freq: Once | INTRAVENOUS | Status: DC

## 2012-07-20 MED ORDER — OXYCODONE HCL 5 MG PO TABS: 5.0000 mg | ORAL_TABLET | Freq: Once | ORAL | Status: DC | PRN

## 2012-07-20 MED ORDER — SODIUM CHLORIDE 0.9 % IR SOLN
Status: DC | PRN
  Administered 2012-07-20: 6000 mL

## 2012-07-20 MED ORDER — LIDOCAINE HCL 4 % MT SOLN
OROMUCOSAL | Status: DC | PRN
  Administered 2012-07-20: 2 mL via TOPICAL

## 2012-07-20 MED ORDER — MIDAZOLAM HCL 2 MG/2ML IJ SOLN
1.0000 mg | INTRAMUSCULAR | Status: DC | PRN
  Administered 2012-07-20: 2 mg via INTRAVENOUS

## 2012-07-20 MED ORDER — LIDOCAINE HCL 1 % IJ SOLN
INTRAMUSCULAR | Status: DC | PRN
  Administered 2012-07-20: 2 mL via INTRADERMAL

## 2012-07-20 MED ORDER — OXYCODONE HCL 5 MG/5ML PO SOLN: 5.0000 mg | Freq: Once | ORAL | Status: DC | PRN

## 2012-07-20 MED ORDER — HYDROMORPHONE HCL PF 1 MG/ML IJ SOLN: 0.2500 mg | INTRAMUSCULAR | Status: DC | PRN

## 2012-07-20 MED ORDER — CLINDAMYCIN PHOSPHATE 300 MG/50ML IV SOLN
300.0000 mg | Freq: Once | INTRAVENOUS | Status: AC
  Administered 2012-07-20: 300 mg via INTRAVENOUS

## 2012-07-20 MED ORDER — LIDOCAINE HCL (CARDIAC) 20 MG/ML IV SOLN
INTRAVENOUS | Status: DC | PRN
  Administered 2012-07-20: 40 mg via INTRAVENOUS

## 2012-07-20 MED ORDER — FENTANYL CITRATE 0.05 MG/ML IJ SOLN
50.0000 ug | INTRAMUSCULAR | Status: DC | PRN
  Administered 2012-07-20: 100 ug via INTRAVENOUS

## 2012-07-20 MED ORDER — SCOPOLAMINE 1 MG/3DAYS TD PT72
1.0000 | MEDICATED_PATCH | Freq: Once | TRANSDERMAL | Status: DC
  Administered 2012-07-20: 1.5 mg via TRANSDERMAL

## 2012-07-20 MED ORDER — EPINEPHRINE HCL 1 MG/ML IJ SOLN
INTRAMUSCULAR | Status: DC | PRN
  Administered 2012-07-20: 2 mg

## 2012-07-20 MED ORDER — OXYCODONE-ACETAMINOPHEN 5-325 MG PO TABS
1.0000 | ORAL_TABLET | Freq: Four times a day (QID) | ORAL | Status: AC | PRN
Start: 2012-07-20 — End: 2012-07-30

## 2012-07-20 MED ORDER — METOCLOPRAMIDE HCL 5 MG/ML IJ SOLN: 10.0000 mg | Freq: Once | INTRAMUSCULAR | Status: DC | PRN

## 2012-07-20 MED ORDER — ROPIVACAINE HCL 5 MG/ML IJ SOLN
INTRAMUSCULAR | Status: DC | PRN
  Administered 2012-07-20: 15 mL

## 2012-07-20 MED ORDER — LACTATED RINGERS IV SOLN
INTRAVENOUS | Status: DC
  Administered 2012-07-20 (×2): via INTRAVENOUS

## 2012-07-20 MED ORDER — DEXAMETHASONE SODIUM PHOSPHATE 4 MG/ML IJ SOLN
INTRAMUSCULAR | Status: DC | PRN
  Administered 2012-07-20: 10 mg via INTRAVENOUS

## 2012-07-20 SURGICAL SUPPLY — 79 items
ANCH SUT 2 FT CRKSW 14.7X5.5 (Anchor) ×4 IMPLANT
ANCH SUT PUSHLCK 24X4.5 STRL (Orthopedic Implant) ×2 IMPLANT
ANCHOR CORKSCREW FIBER 5.5X15 (Anchor) ×4 IMPLANT
APL SKNCLS STERI-STRIP NONHPOA (GAUZE/BANDAGES/DRESSINGS) ×2
BENZOIN TINCTURE PRP APPL 2/3 (GAUZE/BANDAGES/DRESSINGS) ×2 IMPLANT
BLADE SURG 15 STRL LF DISP TIS (BLADE) ×1 IMPLANT
BLADE SURG 15 STRL SS (BLADE) ×3
BLADE VORTEX 6.0 (BLADE) ×3 IMPLANT
CANISTER OMNI JUG 16 LITER (MISCELLANEOUS) ×3 IMPLANT
CANISTER SUCTION 2500CC (MISCELLANEOUS) IMPLANT
CANNULA 5.75X71 LONG (CANNULA) IMPLANT
CANNULA TWIST IN 8.25X7CM (CANNULA) IMPLANT
CLOTH BEACON ORANGE TIMEOUT ST (SAFETY) ×3 IMPLANT
CUTTER MENISCUS  4.2MM (BLADE) ×1
CUTTER MENISCUS 4.2MM (BLADE) ×2 IMPLANT
DECANTER SPIKE VIAL GLASS SM (MISCELLANEOUS) IMPLANT
DRAPE INCISE IOBAN 66X45 STRL (DRAPES) ×3 IMPLANT
DRAPE STERI 35X30 U-POUCH (DRAPES) ×3 IMPLANT
DRAPE SURG 17X23 STRL (DRAPES) ×3 IMPLANT
DRAPE U-SHAPE 47X51 STRL (DRAPES) ×3 IMPLANT
DRAPE U-SHAPE 76X120 STRL (DRAPES) ×6 IMPLANT
DRSG EMULSION OIL 3X3 NADH (GAUZE/BANDAGES/DRESSINGS) ×3 IMPLANT
DRSG PAD ABDOMINAL 8X10 ST (GAUZE/BANDAGES/DRESSINGS) ×6 IMPLANT
DURAPREP 26ML APPLICATOR (WOUND CARE) ×3 IMPLANT
ELECT REM PT RETURN 9FT ADLT (ELECTROSURGICAL) ×3
ELECTRODE REM PT RTRN 9FT ADLT (ELECTROSURGICAL) ×2 IMPLANT
GLOVE BIO SURGEON STRL SZ 6.5 (GLOVE) ×2 IMPLANT
GLOVE BIOGEL PI IND STRL 7.0 (GLOVE) ×1 IMPLANT
GLOVE BIOGEL PI IND STRL 8 (GLOVE) ×4 IMPLANT
GLOVE BIOGEL PI INDICATOR 7.0 (GLOVE) ×1
GLOVE BIOGEL PI INDICATOR 8 (GLOVE) ×2
GLOVE ECLIPSE 7.5 STRL STRAW (GLOVE) ×6 IMPLANT
GOWN BRE IMP PREV XXLGXLNG (GOWN DISPOSABLE) ×3 IMPLANT
GOWN PREVENTION PLUS XLARGE (GOWN DISPOSABLE) ×3 IMPLANT
GOWN PREVENTION PLUS XXLARGE (GOWN DISPOSABLE) ×3 IMPLANT
NDL 1/2 CIR CATGUT .05X1.09 (NEEDLE) IMPLANT
NDL HYPO 18GX1.5 BLUNT FILL (NEEDLE) ×1 IMPLANT
NDL SCORPION MULTI FIRE (NEEDLE) IMPLANT
NDL SUT 6 .5 CRC .975X.05 MAYO (NEEDLE) ×1 IMPLANT
NEEDLE 1/2 CIR CATGUT .05X1.09 (NEEDLE) IMPLANT
NEEDLE HYPO 18GX1.5 BLUNT FILL (NEEDLE) ×3 IMPLANT
NEEDLE MAYO TAPER (NEEDLE) ×3
NEEDLE SCORPION MULTI FIRE (NEEDLE) IMPLANT
NS IRRIG 1000ML POUR BTL (IV SOLUTION) IMPLANT
PACK ARTHROSCOPY DSU (CUSTOM PROCEDURE TRAY) ×3 IMPLANT
PACK BASIN DAY SURGERY FS (CUSTOM PROCEDURE TRAY) ×3 IMPLANT
PASSER SUT SWANSON 36MM LOOP (INSTRUMENTS) IMPLANT
PENCIL BUTTON HOLSTER BLD 10FT (ELECTRODE) ×2 IMPLANT
PUSHLOCK PEEK 4.5X24 (Orthopedic Implant) ×2 IMPLANT
SET IRRIG Y TYPE TUR BLADDER L (SET/KITS/TRAYS/PACK) ×3 IMPLANT
SLING ARM FOAM STRAP LRG (SOFTGOODS) ×2 IMPLANT
SLING ARM FOAM STRAP MED (SOFTGOODS) IMPLANT
SLING ARM FOAM STRAP XLG (SOFTGOODS) IMPLANT
SLING ARM IMMOBILIZER LRG (SOFTGOODS) IMPLANT
SPONGE GAUZE 4X4 12PLY (GAUZE/BANDAGES/DRESSINGS) ×3 IMPLANT
SPONGE LAP 4X18 X RAY DECT (DISPOSABLE) ×2 IMPLANT
STRIP CLOSURE SKIN 1/2X4 (GAUZE/BANDAGES/DRESSINGS) ×2 IMPLANT
SUCTION FRAZIER TIP 10 FR DISP (SUCTIONS) ×2 IMPLANT
SUT ETHIBOND 2 OS 4 DA (SUTURE) IMPLANT
SUT ETHILON 4 0 PS 2 18 (SUTURE) IMPLANT
SUT MNCRL AB 3-0 PS2 18 (SUTURE) ×2 IMPLANT
SUT PDS AB 0 CT 36 (SUTURE) IMPLANT
SUT PROLENE 3 0 PS 2 (SUTURE) IMPLANT
SUT TICRON 1 T 12 (SUTURE) IMPLANT
SUT TIGER TAPE 7 IN WHITE (SUTURE) IMPLANT
SUT VIC AB 0 CT1 27 (SUTURE)
SUT VIC AB 0 CT1 27XBRD ANBCTR (SUTURE) IMPLANT
SUT VIC AB 1 CT1 27 (SUTURE) ×3
SUT VIC AB 1 CT1 27XBRD ANBCTR (SUTURE) ×1 IMPLANT
SUT VIC AB 2-0 SH 27 (SUTURE) ×3
SUT VIC AB 2-0 SH 27XBRD (SUTURE) ×1 IMPLANT
SYR 5ML LL (SYRINGE) ×3 IMPLANT
TAPE FIBER 2MM 7IN #2 BLUE (SUTURE) IMPLANT
TOWEL OR 17X24 6PK STRL BLUE (TOWEL DISPOSABLE) ×3 IMPLANT
TOWEL OR NON WOVEN STRL DISP B (DISPOSABLE) ×3 IMPLANT
TUBE CONNECTING 20X1/4 (TUBING) ×2 IMPLANT
WAND STAR VAC 90 (SURGICAL WAND) ×3 IMPLANT
WATER STERILE IRR 1000ML POUR (IV SOLUTION) ×3 IMPLANT
YANKAUER SUCT BULB TIP NO VENT (SUCTIONS) ×2 IMPLANT

## 2012-07-20 NOTE — Anesthesia Preprocedure Evaluation (Addendum)
Anesthesia Evaluation  Patient identified by MRN, date of birth, ID band Patient awake    Reviewed: Allergy & Precautions, H&P , NPO status , Patient's Chart, lab work & pertinent test results, reviewed documented beta blocker date and time   Airway Mallampati: II TM Distance: >3 FB Neck ROM: full    Dental   Pulmonary neg pulmonary ROS,  breath sounds clear to auscultation        Cardiovascular negative cardio ROS  Rhythm:regular     Neuro/Psych negative neurological ROS  negative psych ROS   GI/Hepatic negative GI ROS, Neg liver ROS,   Endo/Other  negative endocrine ROS  Renal/GU negative Renal ROS  negative genitourinary   Musculoskeletal   Abdominal   Peds  Hematology negative hematology ROS (+)   Anesthesia Other Findings See surgeon's H&P   Reproductive/Obstetrics negative OB ROS                           Anesthesia Physical Anesthesia Plan  ASA: II  Anesthesia Plan: General   Post-op Pain Management:    Induction: Intravenous  Airway Management Planned: Oral ETT  Additional Equipment:   Intra-op Plan:   Post-operative Plan: Extubation in OR  Informed Consent: I have reviewed the patients History and Physical, chart, labs and discussed the procedure including the risks, benefits and alternatives for the proposed anesthesia with the patient or authorized representative who has indicated his/her understanding and acceptance.   Dental Advisory Given  Plan Discussed with: CRNA and Surgeon  Anesthesia Plan Comments:         Anesthesia Quick Evaluation  

## 2012-07-20 NOTE — Brief Op Note (Signed)
07/20/2012  11:11 AM  PATIENT:  Virginia [name]  51 y.o. female  PRE-OPERATIVE DIAGNOSIS:  impingement of left shoulder  POST-OPERATIVE DIAGNOSIS:  impingement of left shoulder and rotator cuff tear  PROCEDURE:  Procedure(s) (LRB) with comments: SHOULDER ARTHROSCOPY WITH OPEN ROTATOR CUFF REPAIR AND DISTAL CLAVICLE ACROMINECTOMY (Left) - Subacromial decompression and biceps tenodesis also  SURGEON:  Surgeon(s) and Role:    * Virginia Junior, MD - Primary  PHYSICIAN ASSISTANT:   ASSISTANTS: bethune   ANESTHESIA:   general  EBL:  Total I/O In: 1000 [I.V.:1000] Out: -   BLOOD ADMINISTERED:none  DRAINS: none   LOCAL MEDICATIONS USED:  MARCAINE     SPECIMEN:  No Specimen  DISPOSITION OF SPECIMEN:  N/A  COUNTS:  YES  TOURNIQUET:  * No tourniquets in log *  DICTATION: .Other Dictation: Dictation Number [id number]  PLAN OF CARE: Discharge to home after PACU  PATIENT DISPOSITION:  PACU - hemodynamically stable.   Delay start of Pharmacological VTE agent (>24hrs) due to surgical blood loss or risk of bleeding: not applicable

## 2012-07-20 NOTE — Anesthesia Postprocedure Evaluation (Signed)
Anesthesia Post Note  Patient: Virginia [name]  Procedure(s) Performed: Procedure(s) (LRB): SHOULDER ARTHROSCOPY WITH OPEN ROTATOR CUFF REPAIR AND DISTAL CLAVICLE ACROMINECTOMY (Left)  Anesthesia type: General  Patient location: PACU  Post pain: Pain level controlled  Post assessment: Patient's Cardiovascular Status Stable  Last Vitals:  Filed Vitals:   07/20/12 1236  BP: 119/71  Pulse: 71  Temp: 36.4 C  Resp: 18    Post vital signs: Reviewed and stable  Level of consciousness: alert  Complications: No apparent anesthesia complications

## 2012-07-20 NOTE — H&P (Signed)
PREOPERATIVE H&P  Chief Complaint: l shoulder pain  HPI: Virginia [name] is a 51 y.o. female who presents for evaluation of l. Shoulder pain. It has been present for greater than 6 months and has been worsening. She has failed conservative measures including injections and PT. Pain is rated as moderate.  Past Medical History  Diagnosis Date  . HLD (hyperlipidemia)     no current med.  . Arthritis   . Wears dentures     upper denture  . Wears partial dentures     lower partial  . Shoulder impingement 06/2012    left   Past Surgical History  Procedure Date  . Total knee arthroplasty 05/04/2011    left  . Knee arthroscopy 05/28/2010    left  . Fulkerson slide 10/19/2007    right knee  . Exploratory laparotomy with abdominal mass excision 06/08/2006    resection right ovarian mass, partial omentectomy  . Appendectomy 06/08/2006  . Salpingoophorectomy 06/08/2006    bilat.  . Carpal tunnel release 01/14/2005    left; with left cubital tunnel release  . Abdominal hysterectomy 1990s    partial  . Knee arthroscopy     right   History   Social History  . Marital Status: Single    Spouse Name: N/A    Number of Children: N/A  . Years of Education: N/A   Social History Main Topics  . Smoking status: Former Smoker -- 2.0 packs/day  . Smokeless tobacco: Never Used   Comment: quit smoking 02/2012  . Alcohol Use: Yes     moderately  . Drug Use: No  . Sexually Active: None   Other Topics Concern  . None   Social History Narrative  . None   Family History  Problem Relation Age of Onset  . Heart attack    . Hypertension    . Diabetes    . COPD     Allergies  Allergen Reactions  . Penicillins Hives and Swelling    SWELLING OF TONGUE  . Rosuvastatin Other (See Comments)    MUSCLE ACHES   Prior to Admission medications   Medication Sig Start Date End Date Taking? Authorizing Provider  FLUoxetine (PROZAC) 20 MG capsule Take 20 mg by mouth daily.   Yes Historical Provider,  MD  HYDROcodone-acetaminophen (NORCO/VICODIN) 5-325 MG per tablet Take 1 tablet by mouth every 6 (six) hours as needed.   Yes Historical Provider, MD  LORazepam (ATIVAN) 0.5 MG tablet Take 0.5 mg by mouth at bedtime.   Yes Historical Provider, MD     Positive ROS: none  All other systems have been reviewed and were otherwise negative with the exception of those mentioned in the HPI and as above.  Physical Exam: Filed Vitals:   07/20/12 0821  BP: 108/78  Pulse: 67  Temp: 98.3 F (36.8 C)  Resp: 16    General: Alert, no acute distress Cardiovascular: No pedal edema Respiratory: No cyanosis, no use of accessory musculature GI: No organomegaly, abdomen is soft and non-tender Skin: No lesions in the area of chief complaint Neurologic: Sensation intact distally Psychiatric: Patient is competent for consent with normal mood and affect Lymphatic: No axillary or cervical lymphadenopathy  MUSCULOSKELETAL: l shoulder:  +impingement, +ttp over lat side. +ttp over [facility] jt  Assessment/Plan: impengement of left shoulder Plan for Procedure(s): SHOULDER ARTHROSCOPY WITH SUBACROMIAL DECOMPRESSION and DCE  The risks benefits and alternatives were discussed with the patient including but not limited to the risks of nonoperative  treatment, versus surgical intervention including infection, bleeding, nerve injury, malunion, nonunion, hardware prominence, hardware failure, need for hardware removal, blood clots, cardiopulmonary complications, morbidity, mortality, among others, and they were willing to proceed.  Predicted outcome is good, although there will be at least a six to nine month expected recovery.  Myliah Medel L, MD 07/20/2012 8:31 AM

## 2012-07-20 NOTE — Anesthesia Procedure Notes (Addendum)
Anesthesia Regional Block:  Interscalene brachial plexus block  Pre-Anesthetic Checklist: ,, timeout performed, Correct Patient, Correct Site, Correct Laterality, Correct Procedure, Correct Position, site marked, Risks and benefits discussed,  Surgical consent,  Pre-op evaluation,  At surgeon's request and post-op pain management  Laterality: Left  Prep: chloraprep       Needles:   Needle Type: Other   (Arrow Echogenic)   Needle Length: 9cm  Needle Gauge: 21    Additional Needles:  Procedures: ultrasound guided Interscalene brachial plexus block Narrative:  Start time: 07/20/2012 8:50 AM End time: 07/20/2012 8:57 AM Injection made incrementally with aspirations every 5 mL.  Performed by: Personally  Anesthesiologist: Aldona Lento, MD  Additional Notes: Ultrasound guidance used to: id relevant anatomy, confirm needle position, local anesthetic spread, avoidance of vascular puncture. Picture saved. No complications. Block performed personally by Janetta Hora. Gelene Mink, MD    Interscalene brachial plexus block Procedure Name: Intubation Date/Time: 07/20/2012 9:33 AM Performed by: Meyer Russel Pre-anesthesia Checklist: Patient identified, Emergency Drugs available, Suction available and Patient being monitored Patient Re-evaluated:Patient Re-evaluated prior to inductionOxygen Delivery Method: Circle system utilized and Simple face mask Preoxygenation: Pre-oxygenation with 100% oxygen Intubation Type: IV induction Ventilation: Mask ventilation without difficulty Laryngoscope Size: Miller and 2 Grade View: Grade I Number of attempts: 1 Airway Equipment and Method: Stylet and LTA kit utilized Placement Confirmation: ETT inserted through vocal cords under direct vision,  breath sounds checked- equal and bilateral and positive ETCO2 (AOI by D. Peason, CRNA) Secured at: 22 cm Tube secured with: Tape Dental Injury: Teeth and Oropharynx as per pre-operative assessment

## 2012-07-20 NOTE — Transfer of Care (Signed)
Immediate Anesthesia Transfer of Care Note  Patient: Virginia [name]  Procedure(s) Performed: Procedure(s) (LRB) with comments: SHOULDER ARTHROSCOPY WITH OPEN ROTATOR CUFF REPAIR AND DISTAL CLAVICLE ACROMINECTOMY (Left) - Subacromial decompression and biceps tenodesis also  Patient Location: PACU  Anesthesia Type: GA combined with regional for post-op pain  Level of Consciousness: awake, alert  and oriented  Airway & Oxygen Therapy: Patient Spontanous Breathing and Patient connected to face mask oxygen  Post-op Assessment: Report given to PACU RN, Post -op Vital signs reviewed and stable and Patient moving all extremities  Post vital signs: Reviewed and stable  Complications: No apparent anesthesia complications

## 2012-07-20 NOTE — Progress Notes (Signed)
Assisted Dr. Frederick with left, ultrasound guided, interscalene  block. Side rails up, monitors on throughout procedure. See vital signs in flow sheet. Tolerated Procedure well. 

## 2012-07-21 NOTE — Op Note (Signed)
[name], [name]                ACCOUNT NO.:  [id number]  MEDICAL RECORD NO.:  [id number]  LOCATION:                                 FACILITY:  PHYSICIAN:  Harvie Junior, M.D.        DATE OF BIRTH:  DATE OF PROCEDURE:  07/20/2012 DATE OF DISCHARGE:                              OPERATIVE REPORT   PREOPERATIVE DIAGNOSES: 1. Impingement. 2. Acromioclavicular joint arthritis.  POSTOPERATIVE DIAGNOSES: 1. Impingement. 2. Acromioclavicular joint arthritis. 3. Rotator cuff tear, acute. 4. Impending biceps tendon rupture. 5. Anterior superior labral tear.  PRINCIPAL PROCEDURE: 1. Mini open rotator cuff repair, but acutely torn rotator cuff. 2. Arthroscopic subacromial decompression from lateral and posterior     compartment. 3. Open bicipital tenodesis within the biceps groove. 4. Arthroscopic distal clavicle resection from an anterior portal. 5. Arthroscopic debridement of anterior superior labral tear from     within the glenohumeral joint.  SURGEON:  Harvie Junior, MD  ASSISTANT:  Marshia Ly, PA  ANESTHESIA:  General.  BRIEF HISTORY:  [name] is a 51 year old female with history of having had significant complaints of pain in the left shoulder, we have treated conservatively for a period of time.  She had had injection therapy, which was beneficial, but her symptoms recurred.  She had activity modification and physical therapy, and after failure of all conservative care, she was taken to the operating room for evaluation and fixation as needed.  PROCEDURE:  The patient was taken to the operating room and after adequate level of anesthesia was obtained with general anesthetic, the patient was placed supine on the operating table.  She was then moved to the beach-chair position.  All bony prominences were well padded. Attention was then turned to the left shoulder where after routine prep and drape, arthroscopic examination of the left shoulder revealed  that there was an obvious anterior superior labral tear, this was debrided with a suction shaver back to a smooth and stable rim.  The biceps tendon looked to have significant issues with flattening and delamination of the fibers.  After probing of the biceps tendon, I felt that this needed to be released.  So, we ultimately felt that we would just do a biceps tenolysis.  Looked over in the area of the supraspinatus and unfortunately, there was a full-thickness rotator cuff tear which was identifiable here.  At that point, we felt the tenodesis may be indicated the biceps tendon and we did a debridement of the undersurface of the supraspinatus tendon.  Once this was completed, attention was turned towards the glenohumeral joint where the camera was removed out of the glenohumeral joint into the subacromial space.  Once this was completed, an arthroscopic subacromial decompression was performed from the lateral and posterior compartment.  Once that was completed, attention was turned to the distal clavicle, which was resected over 20 mm in the anterior compartment.  The rotator cuff tear was identified from the top side as well.  Attention at this time was towards removal of the arthroscopic instruments and a small incision was made over the lateral aspect of the shoulder, subcutaneous tissue down the level of the  deltoid, divided the line with its fibers, and retractors were put in place.  This gave easy access to the rotator cuff tear.  We got the biceps tendon and tagged it.  Once this was done, the greater tuberosity was rectified with a rongeur and the single anchor was then placed in the area of the supraspinatus and four sutures were passed for horizontal mattress repair.  The biceps tendon was then repaired to a suture anchor in the portion of the bicipital groove and one stitch in the subscap, one stitch in the supraspinatus on top of this.  Repair of the biceps gave excellent  repair of the subscap and the full repair of the supraspinatus was now undertaken.  After these were tied, a single PushLock anchor was used in the lateral position to help give a double row repair, this gave a very nice double row repair.  Once this was completed, we had excellent range of motion of the shoulder, full elevation and external rotation.  At this point, the shoulder was copiously and thoroughly irrigated with suctioned dry.  The attention was turned to the deltoid, which was closed with 0 Vicryl running, skin with 0 and 2-0 Vicryl and 3-0 Monocryl.  Benzoin and Steri-Strips were applied.  Sterile compressive dressing was applied.  The patient was taken to the recovery, she was noted to be in satisfactory condition. Estimated blood loss for the procedure was none.     Harvie Junior, M.D.     Ranae Plumber  D:  07/20/2012  T:  07/21/2012  Job:  [id number]

## 2012-07-26 ENCOUNTER — Encounter (HOSPITAL_BASED_OUTPATIENT_CLINIC_OR_DEPARTMENT_OTHER): Payer: Self-pay

## 2013-07-10 ENCOUNTER — Other Ambulatory Visit: Payer: Self-pay | Admitting: Obstetrics and Gynecology

## 2013-07-10 ENCOUNTER — Other Ambulatory Visit: Payer: Self-pay | Admitting: Internal Medicine

## 2013-07-10 DIAGNOSIS — Z1231 Encounter for screening mammogram for malignant neoplasm of breast: Secondary | ICD-10-CM

## 2013-07-20 ENCOUNTER — Ambulatory Visit
Admission: RE | Admit: 2013-07-20 | Discharge: 2013-07-20 | Disposition: A | Discharge status: Home / Self Care | Payer: No Typology Code available for payment source | Source: Ambulatory Visit | Attending: Internal Medicine | Admitting: Internal Medicine

## 2013-07-20 DIAGNOSIS — Z1231 Encounter for screening mammogram for malignant neoplasm of breast: Secondary | ICD-10-CM

## 2013-09-01 LAB — HM COLONOSCOPY

## 2013-09-13 ENCOUNTER — Other Ambulatory Visit: Payer: Self-pay | Admitting: Pain Medicine

## 2013-09-13 DIAGNOSIS — M545 Low back pain, unspecified: Secondary | ICD-10-CM

## 2013-09-13 DIAGNOSIS — M79605 Pain in left leg: Secondary | ICD-10-CM

## 2013-09-21 ENCOUNTER — Ambulatory Visit
Admission: RE | Admit: 2013-09-21 | Discharge: 2013-09-21 | Disposition: A | Discharge status: Home / Self Care | Payer: No Typology Code available for payment source | Source: Ambulatory Visit | Attending: Pain Medicine | Admitting: Pain Medicine

## 2013-09-21 DIAGNOSIS — M545 Low back pain, unspecified: Secondary | ICD-10-CM

## 2013-09-21 DIAGNOSIS — M79605 Pain in left leg: Secondary | ICD-10-CM

## 2015-05-17 DIAGNOSIS — J449 Chronic obstructive pulmonary disease, unspecified: Secondary | ICD-10-CM | POA: Diagnosis not present

## 2015-05-27 ENCOUNTER — Other Ambulatory Visit: Payer: Self-pay | Admitting: Pain Medicine

## 2015-05-27 DIAGNOSIS — M545 Low back pain: Secondary | ICD-10-CM

## 2015-06-07 DIAGNOSIS — E782 Mixed hyperlipidemia: Secondary | ICD-10-CM | POA: Diagnosis not present

## 2015-06-07 DIAGNOSIS — Z7982 Long term (current) use of aspirin: Secondary | ICD-10-CM | POA: Diagnosis not present

## 2015-06-07 DIAGNOSIS — F339 Major depressive disorder, recurrent, unspecified: Secondary | ICD-10-CM | POA: Diagnosis not present

## 2015-06-07 DIAGNOSIS — R5383 Other fatigue: Secondary | ICD-10-CM | POA: Diagnosis not present

## 2015-06-19 DIAGNOSIS — M47817 Spondylosis without myelopathy or radiculopathy, lumbosacral region: Secondary | ICD-10-CM | POA: Diagnosis not present

## 2015-06-19 DIAGNOSIS — M797 Fibromyalgia: Secondary | ICD-10-CM | POA: Diagnosis not present

## 2015-06-19 DIAGNOSIS — Z79899 Other long term (current) drug therapy: Secondary | ICD-10-CM | POA: Diagnosis not present

## 2015-06-19 DIAGNOSIS — G894 Chronic pain syndrome: Secondary | ICD-10-CM | POA: Diagnosis not present

## 2015-06-19 DIAGNOSIS — M4697 Unspecified inflammatory spondylopathy, lumbosacral region: Secondary | ICD-10-CM | POA: Diagnosis not present

## 2015-07-10 DIAGNOSIS — G894 Chronic pain syndrome: Secondary | ICD-10-CM | POA: Diagnosis not present

## 2015-07-10 DIAGNOSIS — M47817 Spondylosis without myelopathy or radiculopathy, lumbosacral region: Secondary | ICD-10-CM | POA: Diagnosis not present

## 2015-07-10 DIAGNOSIS — M4697 Unspecified inflammatory spondylopathy, lumbosacral region: Secondary | ICD-10-CM | POA: Diagnosis not present

## 2015-07-10 DIAGNOSIS — M797 Fibromyalgia: Secondary | ICD-10-CM | POA: Diagnosis not present

## 2015-07-10 DIAGNOSIS — Z79899 Other long term (current) drug therapy: Secondary | ICD-10-CM | POA: Diagnosis not present

## 2015-08-07 DIAGNOSIS — G2581 Restless legs syndrome: Secondary | ICD-10-CM | POA: Diagnosis not present

## 2015-08-07 DIAGNOSIS — R7309 Other abnormal glucose: Secondary | ICD-10-CM | POA: Diagnosis not present

## 2015-08-07 DIAGNOSIS — M5442 Lumbago with sciatica, left side: Secondary | ICD-10-CM | POA: Diagnosis not present

## 2015-08-07 DIAGNOSIS — E780 Pure hypercholesterolemia: Secondary | ICD-10-CM | POA: Diagnosis not present

## 2015-09-04 DIAGNOSIS — M47817 Spondylosis without myelopathy or radiculopathy, lumbosacral region: Secondary | ICD-10-CM | POA: Diagnosis not present

## 2015-09-04 DIAGNOSIS — M5137 Other intervertebral disc degeneration, lumbosacral region: Secondary | ICD-10-CM | POA: Diagnosis not present

## 2015-09-04 DIAGNOSIS — Z79899 Other long term (current) drug therapy: Secondary | ICD-10-CM | POA: Diagnosis not present

## 2015-09-04 DIAGNOSIS — E669 Obesity, unspecified: Secondary | ICD-10-CM | POA: Diagnosis not present

## 2015-09-04 DIAGNOSIS — G894 Chronic pain syndrome: Secondary | ICD-10-CM | POA: Diagnosis not present

## 2015-10-08 DIAGNOSIS — M5137 Other intervertebral disc degeneration, lumbosacral region: Secondary | ICD-10-CM | POA: Diagnosis not present

## 2015-10-08 DIAGNOSIS — G894 Chronic pain syndrome: Secondary | ICD-10-CM | POA: Diagnosis not present

## 2015-10-08 DIAGNOSIS — Z79899 Other long term (current) drug therapy: Secondary | ICD-10-CM | POA: Diagnosis not present

## 2015-10-08 DIAGNOSIS — M47817 Spondylosis without myelopathy or radiculopathy, lumbosacral region: Secondary | ICD-10-CM | POA: Diagnosis not present

## 2015-10-23 DIAGNOSIS — E7800 Pure hypercholesterolemia, unspecified: Secondary | ICD-10-CM | POA: Diagnosis not present

## 2015-10-23 DIAGNOSIS — Z23 Encounter for immunization: Secondary | ICD-10-CM | POA: Diagnosis not present

## 2015-10-23 DIAGNOSIS — F331 Major depressive disorder, recurrent, moderate: Secondary | ICD-10-CM | POA: Diagnosis not present

## 2015-10-23 DIAGNOSIS — G2581 Restless legs syndrome: Secondary | ICD-10-CM | POA: Diagnosis not present

## 2015-10-23 DIAGNOSIS — Z79899 Other long term (current) drug therapy: Secondary | ICD-10-CM | POA: Diagnosis not present

## 2015-11-06 DIAGNOSIS — M545 Low back pain: Secondary | ICD-10-CM | POA: Diagnosis not present

## 2015-11-06 DIAGNOSIS — G894 Chronic pain syndrome: Secondary | ICD-10-CM | POA: Diagnosis not present

## 2015-11-06 DIAGNOSIS — Z79899 Other long term (current) drug therapy: Secondary | ICD-10-CM | POA: Diagnosis not present

## 2015-11-06 DIAGNOSIS — M5137 Other intervertebral disc degeneration, lumbosacral region: Secondary | ICD-10-CM | POA: Diagnosis not present

## 2015-11-06 DIAGNOSIS — M47817 Spondylosis without myelopathy or radiculopathy, lumbosacral region: Secondary | ICD-10-CM | POA: Diagnosis not present

## 2015-11-20 DIAGNOSIS — E782 Mixed hyperlipidemia: Secondary | ICD-10-CM | POA: Diagnosis not present

## 2015-11-20 DIAGNOSIS — Z79899 Other long term (current) drug therapy: Secondary | ICD-10-CM | POA: Diagnosis not present

## 2015-12-04 DIAGNOSIS — M47817 Spondylosis without myelopathy or radiculopathy, lumbosacral region: Secondary | ICD-10-CM | POA: Diagnosis not present

## 2015-12-04 DIAGNOSIS — G894 Chronic pain syndrome: Secondary | ICD-10-CM | POA: Diagnosis not present

## 2015-12-04 DIAGNOSIS — M5137 Other intervertebral disc degeneration, lumbosacral region: Secondary | ICD-10-CM | POA: Diagnosis not present

## 2015-12-04 DIAGNOSIS — M545 Low back pain: Secondary | ICD-10-CM | POA: Diagnosis not present

## 2015-12-04 DIAGNOSIS — Z79899 Other long term (current) drug therapy: Secondary | ICD-10-CM | POA: Diagnosis not present

## 2015-12-04 DIAGNOSIS — E669 Obesity, unspecified: Secondary | ICD-10-CM | POA: Diagnosis not present

## 2016-01-01 DIAGNOSIS — M5137 Other intervertebral disc degeneration, lumbosacral region: Secondary | ICD-10-CM | POA: Diagnosis not present

## 2016-01-01 DIAGNOSIS — M47817 Spondylosis without myelopathy or radiculopathy, lumbosacral region: Secondary | ICD-10-CM | POA: Diagnosis not present

## 2016-01-01 DIAGNOSIS — M545 Low back pain: Secondary | ICD-10-CM | POA: Diagnosis not present

## 2016-01-01 DIAGNOSIS — G894 Chronic pain syndrome: Secondary | ICD-10-CM | POA: Diagnosis not present

## 2016-01-01 DIAGNOSIS — Z79899 Other long term (current) drug therapy: Secondary | ICD-10-CM | POA: Diagnosis not present

## 2016-01-29 DIAGNOSIS — G894 Chronic pain syndrome: Secondary | ICD-10-CM | POA: Diagnosis not present

## 2016-01-29 DIAGNOSIS — M5137 Other intervertebral disc degeneration, lumbosacral region: Secondary | ICD-10-CM | POA: Diagnosis not present

## 2016-01-29 DIAGNOSIS — Z79899 Other long term (current) drug therapy: Secondary | ICD-10-CM | POA: Diagnosis not present

## 2016-02-26 DIAGNOSIS — G894 Chronic pain syndrome: Secondary | ICD-10-CM | POA: Diagnosis not present

## 2016-02-26 DIAGNOSIS — Z79899 Other long term (current) drug therapy: Secondary | ICD-10-CM | POA: Diagnosis not present

## 2016-02-26 DIAGNOSIS — Z79891 Long term (current) use of opiate analgesic: Secondary | ICD-10-CM | POA: Diagnosis not present

## 2016-02-26 DIAGNOSIS — M5137 Other intervertebral disc degeneration, lumbosacral region: Secondary | ICD-10-CM | POA: Diagnosis not present

## 2016-02-26 DIAGNOSIS — M545 Low back pain: Secondary | ICD-10-CM | POA: Diagnosis not present

## 2016-03-03 DIAGNOSIS — R413 Other amnesia: Secondary | ICD-10-CM | POA: Diagnosis not present

## 2016-03-03 DIAGNOSIS — R635 Abnormal weight gain: Secondary | ICD-10-CM | POA: Diagnosis not present

## 2016-03-03 DIAGNOSIS — E785 Hyperlipidemia, unspecified: Secondary | ICD-10-CM | POA: Diagnosis not present

## 2016-03-03 DIAGNOSIS — E559 Vitamin D deficiency, unspecified: Secondary | ICD-10-CM | POA: Diagnosis not present

## 2016-03-03 DIAGNOSIS — R7309 Other abnormal glucose: Secondary | ICD-10-CM | POA: Diagnosis not present

## 2016-03-03 DIAGNOSIS — Z0000 Encounter for general adult medical examination without abnormal findings: Secondary | ICD-10-CM | POA: Diagnosis not present

## 2016-03-19 DIAGNOSIS — D519 Vitamin B12 deficiency anemia, unspecified: Secondary | ICD-10-CM | POA: Diagnosis not present

## 2016-03-19 DIAGNOSIS — R7309 Other abnormal glucose: Secondary | ICD-10-CM | POA: Diagnosis not present

## 2016-03-25 DIAGNOSIS — Z79891 Long term (current) use of opiate analgesic: Secondary | ICD-10-CM | POA: Diagnosis not present

## 2016-03-25 DIAGNOSIS — M79606 Pain in leg, unspecified: Secondary | ICD-10-CM | POA: Diagnosis not present

## 2016-03-25 DIAGNOSIS — M5137 Other intervertebral disc degeneration, lumbosacral region: Secondary | ICD-10-CM | POA: Diagnosis not present

## 2016-03-25 DIAGNOSIS — M47817 Spondylosis without myelopathy or radiculopathy, lumbosacral region: Secondary | ICD-10-CM | POA: Diagnosis not present

## 2016-03-25 DIAGNOSIS — Z79899 Other long term (current) drug therapy: Secondary | ICD-10-CM | POA: Diagnosis not present

## 2016-03-25 DIAGNOSIS — G894 Chronic pain syndrome: Secondary | ICD-10-CM | POA: Diagnosis not present

## 2016-04-01 ENCOUNTER — Other Ambulatory Visit: Payer: Self-pay

## 2016-04-01 DIAGNOSIS — Z1231 Encounter for screening mammogram for malignant neoplasm of breast: Secondary | ICD-10-CM

## 2016-04-01 DIAGNOSIS — D519 Vitamin B12 deficiency anemia, unspecified: Secondary | ICD-10-CM | POA: Diagnosis not present

## 2016-04-15 DIAGNOSIS — E785 Hyperlipidemia, unspecified: Secondary | ICD-10-CM | POA: Diagnosis not present

## 2016-04-15 DIAGNOSIS — D519 Vitamin B12 deficiency anemia, unspecified: Secondary | ICD-10-CM | POA: Diagnosis not present

## 2016-04-15 DIAGNOSIS — Z79899 Other long term (current) drug therapy: Secondary | ICD-10-CM | POA: Diagnosis not present

## 2016-04-16 ENCOUNTER — Ambulatory Visit
Admission: RE | Admit: 2016-04-16 | Discharge: 2016-04-16 | Disposition: A | Discharge status: Home / Self Care | Payer: Medicare Other | Source: Ambulatory Visit

## 2016-04-16 DIAGNOSIS — Z1231 Encounter for screening mammogram for malignant neoplasm of breast: Secondary | ICD-10-CM | POA: Diagnosis not present

## 2016-04-29 DIAGNOSIS — Z79891 Long term (current) use of opiate analgesic: Secondary | ICD-10-CM | POA: Diagnosis not present

## 2016-04-29 DIAGNOSIS — M47817 Spondylosis without myelopathy or radiculopathy, lumbosacral region: Secondary | ICD-10-CM | POA: Diagnosis not present

## 2016-04-29 DIAGNOSIS — Z79899 Other long term (current) drug therapy: Secondary | ICD-10-CM | POA: Diagnosis not present

## 2016-04-29 DIAGNOSIS — M5137 Other intervertebral disc degeneration, lumbosacral region: Secondary | ICD-10-CM | POA: Diagnosis not present

## 2016-04-29 DIAGNOSIS — M79606 Pain in leg, unspecified: Secondary | ICD-10-CM | POA: Diagnosis not present

## 2016-04-29 DIAGNOSIS — G894 Chronic pain syndrome: Secondary | ICD-10-CM | POA: Diagnosis not present

## 2016-05-20 DIAGNOSIS — D519 Vitamin B12 deficiency anemia, unspecified: Secondary | ICD-10-CM | POA: Diagnosis not present

## 2016-05-20 DIAGNOSIS — L559 Sunburn, unspecified: Secondary | ICD-10-CM | POA: Diagnosis not present

## 2016-05-27 DIAGNOSIS — M5137 Other intervertebral disc degeneration, lumbosacral region: Secondary | ICD-10-CM | POA: Diagnosis not present

## 2016-05-27 DIAGNOSIS — G894 Chronic pain syndrome: Secondary | ICD-10-CM | POA: Diagnosis not present

## 2016-05-27 DIAGNOSIS — M79606 Pain in leg, unspecified: Secondary | ICD-10-CM | POA: Diagnosis not present

## 2016-05-27 DIAGNOSIS — Z79899 Other long term (current) drug therapy: Secondary | ICD-10-CM | POA: Diagnosis not present

## 2016-05-27 DIAGNOSIS — M47817 Spondylosis without myelopathy or radiculopathy, lumbosacral region: Secondary | ICD-10-CM | POA: Diagnosis not present

## 2016-05-27 DIAGNOSIS — Z79891 Long term (current) use of opiate analgesic: Secondary | ICD-10-CM | POA: Diagnosis not present

## 2016-06-24 DIAGNOSIS — M5137 Other intervertebral disc degeneration, lumbosacral region: Secondary | ICD-10-CM | POA: Diagnosis not present

## 2016-06-24 DIAGNOSIS — M47817 Spondylosis without myelopathy or radiculopathy, lumbosacral region: Secondary | ICD-10-CM | POA: Diagnosis not present

## 2016-06-24 DIAGNOSIS — Z79899 Other long term (current) drug therapy: Secondary | ICD-10-CM | POA: Diagnosis not present

## 2016-06-24 DIAGNOSIS — G894 Chronic pain syndrome: Secondary | ICD-10-CM | POA: Diagnosis not present

## 2016-06-24 DIAGNOSIS — Z79891 Long term (current) use of opiate analgesic: Secondary | ICD-10-CM | POA: Diagnosis not present

## 2016-06-24 DIAGNOSIS — M79606 Pain in leg, unspecified: Secondary | ICD-10-CM | POA: Diagnosis not present

## 2016-07-22 DIAGNOSIS — M79606 Pain in leg, unspecified: Secondary | ICD-10-CM | POA: Diagnosis not present

## 2016-07-22 DIAGNOSIS — M47817 Spondylosis without myelopathy or radiculopathy, lumbosacral region: Secondary | ICD-10-CM | POA: Diagnosis not present

## 2016-07-22 DIAGNOSIS — M5137 Other intervertebral disc degeneration, lumbosacral region: Secondary | ICD-10-CM | POA: Diagnosis not present

## 2016-07-22 DIAGNOSIS — G894 Chronic pain syndrome: Secondary | ICD-10-CM | POA: Diagnosis not present

## 2016-07-22 DIAGNOSIS — Z79891 Long term (current) use of opiate analgesic: Secondary | ICD-10-CM | POA: Diagnosis not present

## 2016-07-22 DIAGNOSIS — Z79899 Other long term (current) drug therapy: Secondary | ICD-10-CM | POA: Diagnosis not present

## 2016-08-06 DIAGNOSIS — Z8349 Family history of other endocrine, nutritional and metabolic diseases: Secondary | ICD-10-CM | POA: Diagnosis not present

## 2016-08-06 DIAGNOSIS — R7309 Other abnormal glucose: Secondary | ICD-10-CM | POA: Diagnosis not present

## 2016-08-06 DIAGNOSIS — K5900 Constipation, unspecified: Secondary | ICD-10-CM | POA: Diagnosis not present

## 2016-08-06 DIAGNOSIS — L659 Nonscarring hair loss, unspecified: Secondary | ICD-10-CM | POA: Diagnosis not present

## 2016-08-24 DIAGNOSIS — Z79899 Other long term (current) drug therapy: Secondary | ICD-10-CM | POA: Diagnosis not present

## 2016-08-24 DIAGNOSIS — Z79891 Long term (current) use of opiate analgesic: Secondary | ICD-10-CM | POA: Diagnosis not present

## 2016-08-24 DIAGNOSIS — M79606 Pain in leg, unspecified: Secondary | ICD-10-CM | POA: Diagnosis not present

## 2016-08-24 DIAGNOSIS — G894 Chronic pain syndrome: Secondary | ICD-10-CM | POA: Diagnosis not present

## 2016-08-24 DIAGNOSIS — M47817 Spondylosis without myelopathy or radiculopathy, lumbosacral region: Secondary | ICD-10-CM | POA: Diagnosis not present

## 2016-08-24 DIAGNOSIS — M5137 Other intervertebral disc degeneration, lumbosacral region: Secondary | ICD-10-CM | POA: Diagnosis not present

## 2016-09-21 DIAGNOSIS — M5137 Other intervertebral disc degeneration, lumbosacral region: Secondary | ICD-10-CM | POA: Diagnosis not present

## 2016-09-21 DIAGNOSIS — M47817 Spondylosis without myelopathy or radiculopathy, lumbosacral region: Secondary | ICD-10-CM | POA: Diagnosis not present

## 2016-09-21 DIAGNOSIS — G894 Chronic pain syndrome: Secondary | ICD-10-CM | POA: Diagnosis not present

## 2016-09-21 DIAGNOSIS — Z79899 Other long term (current) drug therapy: Secondary | ICD-10-CM | POA: Diagnosis not present

## 2016-09-21 DIAGNOSIS — Z79891 Long term (current) use of opiate analgesic: Secondary | ICD-10-CM | POA: Diagnosis not present

## 2016-10-19 DIAGNOSIS — L309 Dermatitis, unspecified: Secondary | ICD-10-CM | POA: Diagnosis not present

## 2016-10-19 DIAGNOSIS — W57XXXA Bitten or stung by nonvenomous insect and other nonvenomous arthropods, initial encounter: Secondary | ICD-10-CM | POA: Diagnosis not present

## 2016-11-02 DIAGNOSIS — G894 Chronic pain syndrome: Secondary | ICD-10-CM | POA: Diagnosis not present

## 2016-11-02 DIAGNOSIS — M79606 Pain in leg, unspecified: Secondary | ICD-10-CM | POA: Diagnosis not present

## 2016-11-02 DIAGNOSIS — Z79891 Long term (current) use of opiate analgesic: Secondary | ICD-10-CM | POA: Diagnosis not present

## 2016-11-02 DIAGNOSIS — M47817 Spondylosis without myelopathy or radiculopathy, lumbosacral region: Secondary | ICD-10-CM | POA: Diagnosis not present

## 2016-11-02 DIAGNOSIS — Z79899 Other long term (current) drug therapy: Secondary | ICD-10-CM | POA: Diagnosis not present

## 2016-11-02 DIAGNOSIS — M5137 Other intervertebral disc degeneration, lumbosacral region: Secondary | ICD-10-CM | POA: Diagnosis not present

## 2016-12-21 DIAGNOSIS — G894 Chronic pain syndrome: Secondary | ICD-10-CM | POA: Diagnosis not present

## 2016-12-21 DIAGNOSIS — M5137 Other intervertebral disc degeneration, lumbosacral region: Secondary | ICD-10-CM | POA: Diagnosis not present

## 2016-12-21 DIAGNOSIS — Z79899 Other long term (current) drug therapy: Secondary | ICD-10-CM | POA: Diagnosis not present

## 2016-12-21 DIAGNOSIS — M25569 Pain in unspecified knee: Secondary | ICD-10-CM | POA: Diagnosis not present

## 2016-12-21 DIAGNOSIS — Z79891 Long term (current) use of opiate analgesic: Secondary | ICD-10-CM | POA: Diagnosis not present

## 2016-12-21 DIAGNOSIS — M47817 Spondylosis without myelopathy or radiculopathy, lumbosacral region: Secondary | ICD-10-CM | POA: Diagnosis not present

## 2017-01-18 DIAGNOSIS — M545 Low back pain: Secondary | ICD-10-CM | POA: Diagnosis not present

## 2017-01-18 DIAGNOSIS — G894 Chronic pain syndrome: Secondary | ICD-10-CM | POA: Diagnosis not present

## 2017-01-18 DIAGNOSIS — Z79891 Long term (current) use of opiate analgesic: Secondary | ICD-10-CM | POA: Diagnosis not present

## 2017-01-18 DIAGNOSIS — Z79899 Other long term (current) drug therapy: Secondary | ICD-10-CM | POA: Diagnosis not present

## 2017-01-18 DIAGNOSIS — M47817 Spondylosis without myelopathy or radiculopathy, lumbosacral region: Secondary | ICD-10-CM | POA: Diagnosis not present

## 2017-01-18 DIAGNOSIS — M5137 Other intervertebral disc degeneration, lumbosacral region: Secondary | ICD-10-CM | POA: Diagnosis not present

## 2017-01-18 DIAGNOSIS — M25569 Pain in unspecified knee: Secondary | ICD-10-CM | POA: Diagnosis not present

## 2017-02-16 DIAGNOSIS — M25569 Pain in unspecified knee: Secondary | ICD-10-CM | POA: Diagnosis not present

## 2017-02-16 DIAGNOSIS — Z79891 Long term (current) use of opiate analgesic: Secondary | ICD-10-CM | POA: Diagnosis not present

## 2017-02-16 DIAGNOSIS — M5137 Other intervertebral disc degeneration, lumbosacral region: Secondary | ICD-10-CM | POA: Diagnosis not present

## 2017-02-16 DIAGNOSIS — G894 Chronic pain syndrome: Secondary | ICD-10-CM | POA: Diagnosis not present

## 2017-02-16 DIAGNOSIS — Z79899 Other long term (current) drug therapy: Secondary | ICD-10-CM | POA: Diagnosis not present

## 2017-02-16 DIAGNOSIS — M47817 Spondylosis without myelopathy or radiculopathy, lumbosacral region: Secondary | ICD-10-CM | POA: Diagnosis not present

## 2017-03-17 DIAGNOSIS — G894 Chronic pain syndrome: Secondary | ICD-10-CM | POA: Diagnosis not present

## 2017-03-17 DIAGNOSIS — Z79899 Other long term (current) drug therapy: Secondary | ICD-10-CM | POA: Diagnosis not present

## 2017-03-17 DIAGNOSIS — Z79891 Long term (current) use of opiate analgesic: Secondary | ICD-10-CM | POA: Diagnosis not present

## 2017-03-17 DIAGNOSIS — M47817 Spondylosis without myelopathy or radiculopathy, lumbosacral region: Secondary | ICD-10-CM | POA: Diagnosis not present

## 2017-03-17 DIAGNOSIS — M5137 Other intervertebral disc degeneration, lumbosacral region: Secondary | ICD-10-CM | POA: Diagnosis not present

## 2017-03-17 DIAGNOSIS — M25569 Pain in unspecified knee: Secondary | ICD-10-CM | POA: Diagnosis not present

## 2017-03-18 ENCOUNTER — Other Ambulatory Visit: Payer: Self-pay | Admitting: Pain Medicine

## 2017-03-18 DIAGNOSIS — M545 Low back pain: Secondary | ICD-10-CM

## 2017-04-14 ENCOUNTER — Ambulatory Visit
Admission: RE | Admit: 2017-04-14 | Discharge: 2017-04-14 | Disposition: A | Discharge status: Home / Self Care | Payer: Medicare Other | Source: Ambulatory Visit | Attending: Pain Medicine | Admitting: Pain Medicine

## 2017-04-14 DIAGNOSIS — M5126 Other intervertebral disc displacement, lumbar region: Secondary | ICD-10-CM | POA: Diagnosis not present

## 2017-04-14 DIAGNOSIS — Z1389 Encounter for screening for other disorder: Secondary | ICD-10-CM | POA: Diagnosis not present

## 2017-04-14 DIAGNOSIS — M545 Low back pain: Secondary | ICD-10-CM

## 2017-04-14 DIAGNOSIS — M25569 Pain in unspecified knee: Secondary | ICD-10-CM | POA: Diagnosis not present

## 2017-04-14 DIAGNOSIS — Z79891 Long term (current) use of opiate analgesic: Secondary | ICD-10-CM | POA: Diagnosis not present

## 2017-04-14 DIAGNOSIS — Z79899 Other long term (current) drug therapy: Secondary | ICD-10-CM | POA: Diagnosis not present

## 2017-04-14 DIAGNOSIS — M5137 Other intervertebral disc degeneration, lumbosacral region: Secondary | ICD-10-CM | POA: Diagnosis not present

## 2017-04-14 DIAGNOSIS — G894 Chronic pain syndrome: Secondary | ICD-10-CM | POA: Diagnosis not present

## 2017-04-14 DIAGNOSIS — M47817 Spondylosis without myelopathy or radiculopathy, lumbosacral region: Secondary | ICD-10-CM | POA: Diagnosis not present

## 2017-04-14 DIAGNOSIS — L237 Allergic contact dermatitis due to plants, except food: Secondary | ICD-10-CM | POA: Diagnosis not present

## 2017-05-21 DIAGNOSIS — E559 Vitamin D deficiency, unspecified: Secondary | ICD-10-CM | POA: Diagnosis not present

## 2017-05-21 DIAGNOSIS — M5442 Lumbago with sciatica, left side: Secondary | ICD-10-CM | POA: Diagnosis not present

## 2017-05-21 DIAGNOSIS — Z6833 Body mass index (BMI) 33.0-33.9, adult: Secondary | ICD-10-CM | POA: Insufficient documentation

## 2017-05-21 DIAGNOSIS — Z0000 Encounter for general adult medical examination without abnormal findings: Secondary | ICD-10-CM | POA: Diagnosis not present

## 2017-05-21 DIAGNOSIS — R079 Chest pain, unspecified: Secondary | ICD-10-CM | POA: Diagnosis not present

## 2017-05-21 DIAGNOSIS — R7309 Other abnormal glucose: Secondary | ICD-10-CM | POA: Diagnosis not present

## 2017-06-25 ENCOUNTER — Telehealth: Payer: Self-pay | Admitting: Cardiovascular Disease

## 2017-06-25 NOTE — Telephone Encounter (Signed)
Received incoming records from [facility] for upcoming appointment on 07/21/17 @ 3:40pm with Dr. Oval Linsey. Records given to [facility] in Medical Records. 06/25/17 ab

## 2017-07-01 DIAGNOSIS — M545 Low back pain: Secondary | ICD-10-CM | POA: Diagnosis not present

## 2017-07-14 ENCOUNTER — Encounter: Payer: Self-pay | Admitting: *Deleted

## 2017-07-21 ENCOUNTER — Ambulatory Visit: Payer: Medicare Other | Admitting: Cardiovascular Disease

## 2017-09-09 ENCOUNTER — Other Ambulatory Visit: Payer: Self-pay | Admitting: Internal Medicine

## 2017-09-09 DIAGNOSIS — Z1231 Encounter for screening mammogram for malignant neoplasm of breast: Secondary | ICD-10-CM

## 2017-09-29 ENCOUNTER — Ambulatory Visit
Admission: RE | Admit: 2017-09-29 | Discharge: 2017-09-29 | Disposition: A | Discharge status: Home / Self Care | Payer: Medicare Other | Source: Ambulatory Visit | Attending: Internal Medicine | Admitting: Internal Medicine

## 2017-09-29 DIAGNOSIS — Z1231 Encounter for screening mammogram for malignant neoplasm of breast: Secondary | ICD-10-CM | POA: Diagnosis not present

## 2017-10-06 ENCOUNTER — Encounter: Payer: Self-pay | Admitting: Podiatry

## 2017-10-06 ENCOUNTER — Ambulatory Visit: Payer: Medicare Other | Admitting: Podiatry

## 2017-10-06 ENCOUNTER — Ambulatory Visit (INDEPENDENT_AMBULATORY_CARE_PROVIDER_SITE_OTHER): Payer: Medicare Other

## 2017-10-06 DIAGNOSIS — Z0000 Encounter for general adult medical examination without abnormal findings: Secondary | ICD-10-CM | POA: Insufficient documentation

## 2017-10-06 DIAGNOSIS — F324 Major depressive disorder, single episode, in partial remission: Secondary | ICD-10-CM | POA: Insufficient documentation

## 2017-10-06 DIAGNOSIS — R079 Chest pain, unspecified: Secondary | ICD-10-CM | POA: Insufficient documentation

## 2017-10-06 DIAGNOSIS — M21619 Bunion of unspecified foot: Secondary | ICD-10-CM

## 2017-10-06 DIAGNOSIS — M2041 Other hammer toe(s) (acquired), right foot: Secondary | ICD-10-CM | POA: Diagnosis not present

## 2017-10-06 DIAGNOSIS — M21622 Bunionette of left foot: Secondary | ICD-10-CM | POA: Diagnosis not present

## 2017-10-06 DIAGNOSIS — F329 Major depressive disorder, single episode, unspecified: Secondary | ICD-10-CM | POA: Insufficient documentation

## 2017-10-06 DIAGNOSIS — M5442 Lumbago with sciatica, left side: Secondary | ICD-10-CM | POA: Insufficient documentation

## 2017-10-06 DIAGNOSIS — E559 Vitamin D deficiency, unspecified: Secondary | ICD-10-CM | POA: Insufficient documentation

## 2017-10-06 DIAGNOSIS — Z860100 Personal history of colon polyps, unspecified: Secondary | ICD-10-CM | POA: Insufficient documentation

## 2017-10-06 DIAGNOSIS — M21621 Bunionette of right foot: Secondary | ICD-10-CM | POA: Diagnosis not present

## 2017-10-06 DIAGNOSIS — R7309 Other abnormal glucose: Secondary | ICD-10-CM | POA: Insufficient documentation

## 2017-10-06 NOTE — Patient Instructions (Signed)
Pre-Operative Instructions  Congratulations, you have decided to take an important step towards improving your quality of life.  You can be assured that the doctors and staff at [facility] will be with you every step of the way.  Here are some important things you should know:  1. Plan to be at the surgery center/hospital at least 1 (one) hour prior to your scheduled time, unless otherwise directed by the surgical center/hospital staff.  You must have a responsible adult accompany you, remain during the surgery and drive you home.  Make sure you have directions to the surgical center/hospital to ensure you arrive on time. 2. If you are having surgery at [facility] or [facility], you will need a copy of your medical history and physical form from your family physician within one month prior to the date of surgery. We will give you a form for your primary physician to complete.  3. We make every effort to accommodate the date you request for surgery.  However, there are times where surgery dates or times have to be moved.  We will contact you as soon as possible if a change in schedule is required.   4. No aspirin/ibuprofen for one week before surgery.  If you are on aspirin, any non-steroidal anti-inflammatory medications (Mobic, Aleve, Ibuprofen) should not be taken seven (7) days prior to your surgery.  You make take Tylenol for pain prior to surgery.  5. Medications - If you are taking daily heart and blood pressure medications, seizure, reflux, allergy, asthma, anxiety, pain or diabetes medications, make sure you notify the surgery center/hospital before the day of surgery so they can tell you which medications you should take or avoid the day of surgery. 6. No food or drink after midnight the night before surgery unless directed otherwise by surgical center/hospital staff. 7. No alcoholic beverages 24-hours prior to surgery.  No smoking 24-hours prior or 24-hours after  surgery. 8. Wear loose pants or shorts. They should be loose enough to fit over bandages, boots, and casts. 9. Don't wear slip-on shoes. Sneakers are preferred. 10. Bring your boot with you to the surgery center/hospital.  Also bring crutches or a walker if your physician has prescribed it for you.  If you do not have this equipment, it will be provided for you after surgery. 11. If you have not been contacted by the surgery center/hospital by the day before your surgery, call to confirm the date and time of your surgery. 12. Leave-time from work may vary depending on the type of surgery you have.  Appropriate arrangements should be made prior to surgery with your employer. 13. Prescriptions will be provided immediately following surgery by your doctor.  Fill these as soon as possible after surgery and take the medication as directed. Pain medications will not be refilled on weekends and must be approved by the doctor. 14. Remove nail polish on the operative foot and avoid getting pedicures prior to surgery. 15. Wash the night before surgery.  The night before surgery wash the foot and leg well with water and the antibacterial soap provided. Be sure to pay special attention to beneath the toenails and in between the toes.  Wash for at least three (3) minutes. Rinse thoroughly with water and dry well with a towel.  Perform this wash unless told not to do so by your physician.  Enclosed: 1 Ice pack (please put in freezer the night before surgery)   1 Hibiclens skin cleaner     Pre-op instructions  If you have any questions regarding the instructions, please do not hesitate to call our office.  [place]: [address], [place], [place] [address] -- [phone]  [place]: [address] [place], [place] [address] -- [phone]  [place]: [address]  [place],  [address] -- [phone]  [place]: [address], [address], [place],  [address] -- [phone]  Website:  [email or web address] 

## 2017-10-12 NOTE — Progress Notes (Signed)
   Subjective: 56 year old female presenting today as a new patient with a complaint of bilateral bunions and hammertoes, right worse than left that have been symptomatic for the past 7 months. She reports associated pain to the left forefoot. She has not done anything for treatment. Standing and walking for long periods of time makes the pain worse. Resting the feet help alleviate the pain. Patient is here for further evaluation and treatment.    Past Medical History:  Diagnosis Date  . Arthritis   . HLD (hyperlipidemia)    no current med.  . Shoulder impingement 06/2012   left  . Wears dentures    upper denture  . Wears partial dentures    lower partial     Objective: Physical Exam General: The patient is alert and oriented x3 in no acute distress.  Dermatology: Skin is cool, dry and supple bilateral lower extremities. Negative for open lesions or macerations.  Vascular: Palpable pedal pulses bilaterally. No edema or erythema noted. Capillary refill within normal limits.  Neurological: Epicritic and protective threshold grossly intact bilaterally.   Musculoskeletal Exam: Clinical evidence of bunion deformity noted to the respective foot. There is a moderate pain on palpation range of motion of the first MPJ. Lateral deviation of the hallux noted consistent with hallux abductovalgus. Hammertoe contracture also noted on clinical exam to digit 2 of the right foot. Symptomatic pain on palpation and range of motion also noted to the metatarsal phalangeal joints of the respective hammertoe digits.   Clinical evidence of Tailor's bunion deformity noted to the respective foot. There is a moderate pain on palpation range of motion of the fifth MPJ.   Radiographic Exam: Increased intermetatarsal angle greater than 15 with a hallux abductus angle greater than 30 noted on AP view. Moderate degenerative changes noted within the first MPJ. Contracture deformity also noted to the interphalangeal  joints and MPJs of the digits of the respective hammertoes. Increased intermetatarsal angle to the fourth interspace of the respective foot. Prominent fifth metatarsal head.   Assessment: 1. 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 Hammertoe deformity 2nd digit right foot 3. Tailor's bunion bilaterally, right greater than left   Plan of Care:  1. Patient was evaluated. X-Rays reviewed. 2. Today we discussed the conservative versus surgical management of the presenting pathology. The patient opts for surgical management. All possible complications and details of the procedure were explained. All patient questions were answered. No guarantees were expressed or implied. 3. Authorization for surgery was initiated today. Surgery will consist of :  - bunionectomy with metatarsal osteotomy right - PIPJ arthroplasty 2nd right - MPJ capsulotomy 2nd right - Tailor's bunionectomy with metatarsal osteotomy right  4. Return to clinic 1 week post op.    Edrick Kins, DPM [facility]  Dr. Edrick Kins, [place]                                        [place], [place] [address]                Office [phone]  Fax [phone]

## 2017-12-01 DIAGNOSIS — R7309 Other abnormal glucose: Secondary | ICD-10-CM | POA: Diagnosis not present

## 2017-12-01 DIAGNOSIS — E785 Hyperlipidemia, unspecified: Secondary | ICD-10-CM | POA: Diagnosis not present

## 2017-12-01 DIAGNOSIS — Z79899 Other long term (current) drug therapy: Secondary | ICD-10-CM | POA: Diagnosis not present

## 2018-01-13 ENCOUNTER — Other Ambulatory Visit: Payer: Self-pay | Admitting: Podiatry

## 2018-01-13 ENCOUNTER — Encounter: Payer: Self-pay | Admitting: Podiatry

## 2018-01-13 DIAGNOSIS — M7751 Other enthesopathy of right foot: Secondary | ICD-10-CM | POA: Diagnosis not present

## 2018-01-13 DIAGNOSIS — M21611 Bunion of right foot: Secondary | ICD-10-CM | POA: Diagnosis not present

## 2018-01-13 DIAGNOSIS — M21541 Acquired clubfoot, right foot: Secondary | ICD-10-CM | POA: Diagnosis not present

## 2018-01-13 DIAGNOSIS — M2042 Other hammer toe(s) (acquired), left foot: Secondary | ICD-10-CM | POA: Diagnosis not present

## 2018-01-13 DIAGNOSIS — M2041 Other hammer toe(s) (acquired), right foot: Secondary | ICD-10-CM | POA: Diagnosis not present

## 2018-01-13 DIAGNOSIS — M21621 Bunionette of right foot: Secondary | ICD-10-CM | POA: Diagnosis not present

## 2018-01-13 DIAGNOSIS — M2011 Hallux valgus (acquired), right foot: Secondary | ICD-10-CM | POA: Diagnosis not present

## 2018-01-13 DIAGNOSIS — E7800 Pure hypercholesterolemia, unspecified: Secondary | ICD-10-CM | POA: Diagnosis not present

## 2018-01-13 DIAGNOSIS — M25571 Pain in right ankle and joints of right foot: Secondary | ICD-10-CM | POA: Diagnosis not present

## 2018-01-19 ENCOUNTER — Encounter: Payer: Self-pay | Admitting: Podiatry

## 2018-01-19 ENCOUNTER — Ambulatory Visit (INDEPENDENT_AMBULATORY_CARE_PROVIDER_SITE_OTHER): Payer: Medicare Other

## 2018-01-19 ENCOUNTER — Ambulatory Visit (INDEPENDENT_AMBULATORY_CARE_PROVIDER_SITE_OTHER): Payer: Medicare Other | Admitting: Podiatry

## 2018-01-19 DIAGNOSIS — M21619 Bunion of unspecified foot: Secondary | ICD-10-CM

## 2018-01-19 DIAGNOSIS — Z9889 Other specified postprocedural states: Secondary | ICD-10-CM

## 2018-01-19 MED ORDER — OXYCODONE-ACETAMINOPHEN 5-325 MG PO TABS: 1.0000 | ORAL_TABLET | Freq: Three times a day (TID) | ORAL | 0 refills | Status: DC | PRN

## 2018-01-21 NOTE — Progress Notes (Signed)
Bunionectomy right foot. Tailors bunionectomy with fifth metatarsal osteotomy right. Hammertoe repair, second digit right foot.

## 2018-01-23 NOTE — Progress Notes (Signed)
   Subjective:  Patient presents today status post right foot surgery. DOS: 01/13/18. She states her pain is unchanged and rates it at a 6/10. She has been icing the foot and taking antiinflammatory medication. Patient is here for further evaluation and treatment.    Past Medical History:  Diagnosis Date  . Arthritis   . HLD (hyperlipidemia)    no current med.  . Shoulder impingement 06/2012   left  . Wears dentures    upper denture  . Wears partial dentures    lower partial      Objective/Physical Exam Neurovascular status intact.  Skin incisions appear to be well coapted with sutures and staples intact. No sign of infectious process noted. No dehiscence. No active bleeding noted. Moderate edema noted to the surgical extremity.  Radiographic Exam:  Orthopedic hardware and osteotomies sites appear to be stable with routine healing.  Assessment: 1. s/p right foot surgery. DOS: 01/13/18   Plan of Care:  1. Patient was evaluated. X-rays reviewed 2. Dressing changed. Keep clean, dry and intact for one week.  3. Continue weightbearing in CAM boot.  4. Refill prescription for Percocet 5/325 mg #30 provided to patient.  5. Return to clinic in one week.    Edrick Kins, DPM [facility]  Dr. Edrick Kins, [place]                                        [place], [place] [address]                Office [phone]  Fax [phone]

## 2018-01-26 ENCOUNTER — Ambulatory Visit (INDEPENDENT_AMBULATORY_CARE_PROVIDER_SITE_OTHER): Payer: Medicare Other | Admitting: Podiatry

## 2018-01-26 ENCOUNTER — Encounter: Payer: Self-pay | Admitting: Podiatry

## 2018-01-26 DIAGNOSIS — Z9889 Other specified postprocedural states: Secondary | ICD-10-CM

## 2018-01-27 NOTE — Progress Notes (Signed)
   Subjective:  Patient presents today status post right foot surgery. DOS: 01/13/18. She reports improvement in the swelling of the foot. She states the incisions look well. Patient is here for further evaluation and treatment.    Past Medical History:  Diagnosis Date  . Arthritis   . HLD (hyperlipidemia)    no current med.  . Shoulder impingement 06/2012   left  . Wears dentures    upper denture  . Wears partial dentures    lower partial      Objective/Physical Exam Neurovascular status intact.  Skin incisions appear to be well coapted with sutures and staples intact. No sign of infectious process noted. No dehiscence. No active bleeding noted. Moderate edema noted to the surgical extremity.  Assessment: 1. s/p right foot surgery. DOS: 01/13/18   Plan of Care:  1. Patient was evaluated. 2. Staples removed.  3. Continue weightbearing in CAM boot for 2 weeks.  4. Return to clinic in 1 week with Janett Billow, RN for dressing change.  5. Return to clinic in 2 weeks for pin removal.    Edrick Kins, DPM [facility]  Dr. Edrick Kins, [address]                                        [place], [place] [address]                Office [phone]  Fax [phone]

## 2018-02-02 ENCOUNTER — Ambulatory Visit (INDEPENDENT_AMBULATORY_CARE_PROVIDER_SITE_OTHER): Payer: Self-pay

## 2018-02-02 DIAGNOSIS — M2041 Other hammer toe(s) (acquired), right foot: Secondary | ICD-10-CM

## 2018-02-02 DIAGNOSIS — M21619 Bunion of unspecified foot: Secondary | ICD-10-CM

## 2018-02-02 MED ORDER — OXYCODONE-ACETAMINOPHEN 5-325 MG PO TABS: 1.0000 | ORAL_TABLET | Freq: Three times a day (TID) | ORAL | 0 refills | Status: DC | PRN

## 2018-02-07 NOTE — Progress Notes (Signed)
Patient presents for dressing change. Noted well healing surgical incisions, no open areas or drainge and no s/s of infection. Follow up with Dr Amalia Hailey

## 2018-02-09 ENCOUNTER — Ambulatory Visit (INDEPENDENT_AMBULATORY_CARE_PROVIDER_SITE_OTHER): Payer: Medicare Other

## 2018-02-09 ENCOUNTER — Encounter: Payer: Self-pay | Admitting: Podiatry

## 2018-02-09 ENCOUNTER — Ambulatory Visit (INDEPENDENT_AMBULATORY_CARE_PROVIDER_SITE_OTHER): Payer: Medicare Other | Admitting: Podiatry

## 2018-02-09 DIAGNOSIS — M21619 Bunion of unspecified foot: Secondary | ICD-10-CM

## 2018-02-09 DIAGNOSIS — Z9889 Other specified postprocedural states: Secondary | ICD-10-CM

## 2018-02-16 ENCOUNTER — Telehealth: Payer: Self-pay | Admitting: *Deleted

## 2018-02-16 NOTE — Telephone Encounter (Signed)
Pt states has an open spot on the suture line that looks open.

## 2018-02-16 NOTE — Progress Notes (Signed)
   Subjective:  Patient presents today status post right foot surgery. DOS: 01/13/18. She reports some numbness of the toes of the foot. She has been wearing the CAM boot as instructed. Patient is here for further evaluation and treatment.    Past Medical History:  Diagnosis Date  . Arthritis   . HLD (hyperlipidemia)    no current med.  . Shoulder impingement 06/2012   left  . Wears dentures    upper denture  . Wears partial dentures    lower partial      Objective/Physical Exam Neurovascular status intact.  Skin incisions appear to be well coapted. No sign of infectious process noted. No dehiscence. No active bleeding noted. Moderate edema noted to the surgical extremity.  Radiographic Exam:  Orthopedic hardware and osteotomies sites appear to be stable with routine healing.  Assessment: 1. s/p right foot surgery. DOS: 01/13/18   Plan of Care:  1. Patient was evaluated. X-Rays reviewed.  2. Pin removed today.  3. Compression anklet dispensed.  4. Post op shoe dispensed to wear for four weeks.  5. Return to clinic in 4 weeks.    Edrick Kins, DPM [facility]  Dr. Edrick Kins, [place]                                        [place], [place] [address]                Office [phone]  Fax [phone]

## 2018-02-16 NOTE — Telephone Encounter (Signed)
I spoke with pt and she states after her shower today the area looked like it was going to pop and she touched it and it did pop. I told pt to keep the area clean and after shower pat dry and cover neosporin dressing until seen in office. Pt states understanding and I transferred to schedulers.

## 2018-02-17 ENCOUNTER — Ambulatory Visit (INDEPENDENT_AMBULATORY_CARE_PROVIDER_SITE_OTHER): Payer: Self-pay | Admitting: Podiatry

## 2018-02-17 VITALS — Temp 97.9°F

## 2018-02-17 DIAGNOSIS — T8130XA Disruption of wound, unspecified, initial encounter: Secondary | ICD-10-CM

## 2018-02-17 DIAGNOSIS — L539 Erythematous condition, unspecified: Secondary | ICD-10-CM

## 2018-02-17 DIAGNOSIS — M21619 Bunion of unspecified foot: Secondary | ICD-10-CM

## 2018-02-17 MED ORDER — MUPIROCIN 2 % EX OINT: 1.0000 "application " | TOPICAL_OINTMENT | Freq: Two times a day (BID) | CUTANEOUS | 2 refills | Status: DC

## 2018-02-17 MED ORDER — CLINDAMYCIN HCL 300 MG PO CAPS: 300.0000 mg | ORAL_CAPSULE | Freq: Three times a day (TID) | ORAL | 0 refills | Status: DC

## 2018-02-20 NOTE — Progress Notes (Signed)
Subjective: Virginia [name] is a 57 y.o. is seen today in office s/p right foot bunionectomy and tailors bunion repair preformed on 01/13/2018 with Dr. Amalia Hailey.  She presents today for an acute appointment she called yesterday stating that there was a hole on the incision that she noticed yesterday.  She is also noticed some mild swelling and redness.  She denies any drainage or pus.  She states that her pain is controlled otherwise.  She has not on any antibiotics at this time. Denies any systemic complaints such as fevers, chills, nausea, vomiting. No calf pain, chest pain, shortness of breath.   Objective: General: No acute distress, AAOx3  DP/PT pulses palpable 2/4, CRT < 3 sec to all digits.  Protective sensation intact. Motor function intact.  RIGHT foot: Incision is well coapted except for the tailor's bunion incision which along the central aspect there is a small opening measuring approximately 0.2 x 0.2 cm and has a depth of approximately 0.2 cm.  There is no probing to bone or hardware.  There is localized erythema gently along the incision but there is no ascending cellulitis.  Mild increase in warmth.  No fluctuation or crepitation.  There is no malodor.  The remainder of the incisions remained well coapted without any openings or signs of infection.  No other open lesions or pre-ulcerative lesions.  No pain with calf compression, swelling, warmth, erythema.   Assessment and Plan:  Status post right foot surgery with localized erythema and wound dehiscence  -Treatment options discussed including all alternatives, risks, and complications -I was able to debride the opening and only small amount of bloody drainage is expressed there is no purulence.  I did not culture this area as it was superficial and this would have been a superficial culture and there is no purulence.  However given the localized erythema as well as the opening I did prescribe clindamycin.  Also prescribed mupirocin  ointment and I wonder applied to the wound daily.  Remain in surgical shoe. -Declined new x-ray. She said they were taken last week.  -Ice/elevation -Pain medication as needed. -Monitor for any clinical signs or symptoms of infection and DVT/PE and directed to call the office immediately should any occur or go to the ER. -Follow-up on Monday with Dr. Amalia Hailey or sooner if any problems arise. In the meantime, encouraged to call the office with any questions, concerns, change in symptoms.   Celesta Gentile, DPM

## 2018-02-21 ENCOUNTER — Ambulatory Visit (INDEPENDENT_AMBULATORY_CARE_PROVIDER_SITE_OTHER): Payer: Medicare Other | Admitting: Podiatry

## 2018-02-21 DIAGNOSIS — Z9889 Other specified postprocedural states: Secondary | ICD-10-CM

## 2018-02-28 NOTE — Progress Notes (Signed)
   Subjective:  Patient presents today status post right foot bunionectomy with tailor's bunionectomy. DOS: 01/13/2018.  Patient was last seen on 02/17/2018 under the care of Dr. Carman Ching at which time she was diagnosed with some postoperative infection and antibiotics were prescribed apparently.  She states that she is feeling much better after the antibiotics.  She presents today for further treatment evaluation  Past Medical History:  Diagnosis Date  . Arthritis   . HLD (hyperlipidemia)    no current med.  . Shoulder impingement 06/2012   left  . Wears dentures    upper denture  . Wears partial dentures    lower partial      Objective/Physical Exam Neurovascular status intact.  Skin incisions appear to be well coapted with exception of 2 small focal areas of dehiscence along the incision site.. No sign of infectious process noted today. No dehiscence. No active bleeding noted. Moderate edema noted to the surgical extremity.  Radiographic Exam:  Orthopedic hardware and osteotomies sites appear to be stable with routine healing.  Assessment: 1. s/p bunionectomy with tailor's bunionectomy right foot. DOS: 01/13/2018   Plan of Care:  1. Patient was evaluated.  2.  Continue oral antibiotics as prescribed 3.  Recommend daily dressing changes.  Keep dressings clean and dry. 4.  Return to clinic in 2 weeks   Edrick Kins, DPM [facility]  Dr. Edrick Kins, [address] [place]                                        [place], [place] [address]                Office [phone]  Fax [phone]

## 2018-03-09 ENCOUNTER — Ambulatory Visit: Payer: Medicare Other | Admitting: Podiatry

## 2018-03-11 ENCOUNTER — Other Ambulatory Visit: Payer: Self-pay | Admitting: Podiatry

## 2018-03-11 NOTE — Telephone Encounter (Signed)
Per Dr. Evans verbal order, ok to refill Meloxicam.   Script has been sent to pharmacy 

## 2018-03-21 ENCOUNTER — Ambulatory Visit (INDEPENDENT_AMBULATORY_CARE_PROVIDER_SITE_OTHER): Payer: Medicare Other

## 2018-03-21 ENCOUNTER — Ambulatory Visit (INDEPENDENT_AMBULATORY_CARE_PROVIDER_SITE_OTHER): Payer: Medicare Other | Admitting: Podiatry

## 2018-03-21 DIAGNOSIS — M21619 Bunion of unspecified foot: Secondary | ICD-10-CM | POA: Diagnosis not present

## 2018-03-21 DIAGNOSIS — Z9889 Other specified postprocedural states: Secondary | ICD-10-CM

## 2018-03-23 NOTE — Progress Notes (Signed)
   Subjective:  Patient presents today status post right foot bunionectomy with tailor's bunionectomy. DOS: 01/13/2018. She states she is feeling well. She reports some numbness and stiffness of the foot. There are no modifying factors noted. Patient is here for further evaluation and treatment.   Past Medical History:  Diagnosis Date  . Arthritis   . HLD (hyperlipidemia)    no current med.  . Shoulder impingement 06/2012   left  . Wears dentures    upper denture  . Wears partial dentures    lower partial      Objective/Physical Exam Neurovascular status intact.  Skin incisions appear to be well coapted with exception of 2 small focal areas of dehiscence along the incision site.. No sign of infectious process noted today. No dehiscence. No active bleeding noted. Moderate edema noted to the surgical extremity.  Radiographic Exam:  Orthopedic hardware and osteotomies sites appear to be stable with routine healing.  Assessment: 1. s/p bunionectomy with tailor's bunionectomy right foot. DOS: 01/13/2018   Plan of Care:  1. Patient was evaluated. X-Rays reviewed.  2. May resume full activity with no restrictions.  3. Recommended good shoe gear.  4. Return to clinic as needed.    Edrick Kins, DPM [facility]  Dr. Edrick Kins, [place]                                        [place], [place] [address]                Office [phone]  Fax [phone]

## 2018-07-21 DIAGNOSIS — G4700 Insomnia, unspecified: Secondary | ICD-10-CM | POA: Diagnosis not present

## 2018-07-21 DIAGNOSIS — Z6836 Body mass index (BMI) 36.0-36.9, adult: Secondary | ICD-10-CM

## 2018-07-21 DIAGNOSIS — Z79899 Other long term (current) drug therapy: Secondary | ICD-10-CM

## 2018-07-21 DIAGNOSIS — E785 Hyperlipidemia, unspecified: Secondary | ICD-10-CM | POA: Diagnosis not present

## 2018-07-21 DIAGNOSIS — F419 Anxiety disorder, unspecified: Secondary | ICD-10-CM | POA: Diagnosis not present

## 2018-07-21 DIAGNOSIS — M545 Low back pain: Secondary | ICD-10-CM | POA: Diagnosis not present

## 2018-09-01 ENCOUNTER — Encounter: Payer: Self-pay | Admitting: Internal Medicine

## 2018-09-01 ENCOUNTER — Ambulatory Visit (INDEPENDENT_AMBULATORY_CARE_PROVIDER_SITE_OTHER): Payer: Medicare Other | Admitting: Internal Medicine

## 2018-09-01 VITALS — BP 110/78 | HR 72 | Temp 97.9°F | Ht 63.0 in | Wt 144.4 lb

## 2018-09-01 DIAGNOSIS — F5101 Primary insomnia: Secondary | ICD-10-CM | POA: Diagnosis not present

## 2018-09-01 DIAGNOSIS — Z79899 Other long term (current) drug therapy: Secondary | ICD-10-CM

## 2018-09-01 DIAGNOSIS — F331 Major depressive disorder, recurrent, moderate: Secondary | ICD-10-CM | POA: Diagnosis not present

## 2018-09-01 DIAGNOSIS — E7800 Pure hypercholesterolemia, unspecified: Secondary | ICD-10-CM | POA: Diagnosis not present

## 2018-09-01 NOTE — Progress Notes (Signed)
Subjective:     Patient ID: Virginia [name] , female    DOB: October 05, 1961 , 57 y.o.   MRN: [id number]  Hyperlipidemia  She is here today for a cholesterol check. She has been intolerant of statins in the past. However, she has been able to tolerate Livalo. She denies having any issues with the medication. She admits that she is not exercising as she should.    Insomnia  Additionally, she needs refill on temazepam. This has helped her to get better sleep at night.   Past Medical History:  Diagnosis Date  . Anxiety   . Arthritis   . Chest pain, unspecified   . Dermatitis   . HLD (hyperlipidemia)    no current med.  . Insomnia   . Loss of hair   . Low back pain   . Major depressive disorder, single episode, mild ([place])   . Personal history of colonic polyps   . Shoulder impingement 06/2012   left  . Vitamin D deficiency   . Wears dentures    upper denture  . Wears partial dentures    lower partial      Current Outpatient Medications:  .  Cholecalciferol (D2000 ULTRA STRENGTH) 2000 units CAPS, Take by mouth., Disp: , Rfl:  .  DESVENLAFAXINE ER PO, Take by mouth., Disp: , Rfl:  .  LIVALO 2 MG TABS, TK 1 T PO QD, Disp: , Rfl: 2 .  naproxen sodium (ALEVE) 220 MG tablet, Take 220 mg by mouth., Disp: , Rfl:    Allergies  Allergen Reactions  . Doxycycline Itching  . Penicillins Hives and Swelling    SWELLING OF TONGUE SWELLING OF JOINTS  . Rosuvastatin Other (See Comments)    MUSCLE ACHES     Review of Systems  Constitutional: Negative.   Respiratory: Negative.   Cardiovascular: Negative.   Gastrointestinal: Negative.   Neurological: Negative.   Psychiatric/Behavioral: Positive for sleep disturbance.     Today's Vitals   09/01/18 1451  BP: 110/78  Pulse: 72  Temp: 97.9 F (36.6 C)  TempSrc: Oral  SpO2: 96%  Weight: 144 lb 6.4 oz (65.5 kg)  Height: 5\' 3"  (1.6 m)   Body mass index is 25.58 kg/m.   Objective:  Physical Exam  Constitutional: She is oriented to  person, place, and time. She appears well-developed and well-nourished.  HENT:  Head: Normocephalic and atraumatic.  Eyes: EOM are normal.  Cardiovascular: Normal rate, regular rhythm and normal heart sounds.  Pulmonary/Chest: Effort normal and breath sounds normal.  Neurological: She is alert and oriented to person, place, and time.  Psychiatric: She has a normal mood and affect.  Nursing note and vitals reviewed.       Assessment And Plan:         1. Pure hypercholesterolemia  I will check lipid panel and LFTs today.  SHE IS ENCOURAGED TO EXERCISE FIVE DAYS WEEKLY FOR AT LEAST 30 MINUTES, AVOID FRIED FOODS, EAT 25-35 GRAMS OF FIBER, AND TO EAT FISH AT LEAST TWICE WEEKLY.  - Lipid Profile  2. Primary insomnia  Chronic. She is encouraged to take mg nightly. Refill for temazepam was sent to the pharmacy. I will consider trazodone at her next visit.  3. Moderate episode of recurrent major depressive disorder (HCC)  Chronic. She wishes to stop Pristiq. I will start her on viibryd. She will start with 10mg  once daily x 7 days, then 20mg  once daily. She will rto in six weeks for re-evaluation. Possible side  effects were discussed with the patient as well.   4. Long term use of drug  - Liver Profile   Maximino Greenland, MD

## 2018-09-02 LAB — HEPATIC FUNCTION PANEL
ALT: 28 IU/L (ref 0–32)
AST: 53 IU/L — ABNORMAL HIGH (ref 0–40)
Albumin: 4.5 g/dL (ref 3.5–5.5)
Alkaline Phosphatase: 88 IU/L (ref 39–117)
Bilirubin Total: 0.3 mg/dL (ref 0.0–1.2)
Bilirubin, Direct: 0.06 mg/dL (ref 0.00–0.40)
Total Protein: 7.1 g/dL (ref 6.0–8.5)

## 2018-09-02 LAB — LIPID PANEL
Chol/HDL Ratio: 3.6 ratio (ref 0.0–4.4)
Cholesterol, Total: 157 mg/dL (ref 100–199)
HDL: 44 mg/dL (ref >39)
LDL Calculated: 81 mg/dL (ref 0–99)
Triglycerides: 160 mg/dL — ABNORMAL HIGH (ref 0–149)
VLDL Cholesterol Cal: 32 mg/dL (ref 5–40)

## 2018-09-03 NOTE — Progress Notes (Signed)
Here are your lab results:  Your cholesterol looks much better of course since you have resumed the Livalo. Your triglycerides are still elevated. I would like for you to increase your daily activity level. I would also like for you to decrease your intake of processed foods.   Your liver enzymes are elevated - likely due to fatty liver. Again, it is important to incorporate more exercise into your daily routine. Please let me know if you have any questions.   Sincerely,    Zarek Relph N. [facility], MD

## 2018-09-08 ENCOUNTER — Telehealth: Payer: Self-pay

## 2018-09-08 ENCOUNTER — Other Ambulatory Visit: Payer: Self-pay | Admitting: Internal Medicine

## 2018-09-08 MED ORDER — TEMAZEPAM 30 MG PO CAPS: 30.0000 mg | ORAL_CAPSULE | Freq: Every evening | ORAL | 2 refills | Status: DC | PRN

## 2018-09-08 NOTE — Telephone Encounter (Signed)
The patient called and requested a refill of temazepam.

## 2018-09-11 ENCOUNTER — Other Ambulatory Visit: Payer: Self-pay | Admitting: Internal Medicine

## 2018-09-25 ENCOUNTER — Encounter: Payer: Self-pay | Admitting: Internal Medicine

## 2018-10-10 ENCOUNTER — Encounter: Payer: Self-pay | Admitting: Internal Medicine

## 2018-10-14 ENCOUNTER — Other Ambulatory Visit: Payer: Self-pay | Admitting: Internal Medicine

## 2018-10-31 ENCOUNTER — Ambulatory Visit
Admission: RE | Admit: 2018-10-31 | Discharge: 2018-10-31 | Disposition: A | Discharge status: Home / Self Care | Payer: Medicare Other | Source: Ambulatory Visit | Attending: Internal Medicine | Admitting: Internal Medicine

## 2018-10-31 ENCOUNTER — Other Ambulatory Visit: Payer: Self-pay | Admitting: Internal Medicine

## 2018-10-31 DIAGNOSIS — Z1231 Encounter for screening mammogram for malignant neoplasm of breast: Secondary | ICD-10-CM

## 2018-11-01 ENCOUNTER — Encounter: Payer: Self-pay | Admitting: Internal Medicine

## 2018-11-22 ENCOUNTER — Encounter: Payer: Self-pay | Admitting: Internal Medicine

## 2018-12-05 ENCOUNTER — Ambulatory Visit (INDEPENDENT_AMBULATORY_CARE_PROVIDER_SITE_OTHER): Payer: Medicare Other | Admitting: Internal Medicine

## 2018-12-05 ENCOUNTER — Encounter: Payer: Self-pay | Admitting: Internal Medicine

## 2018-12-05 VITALS — BP 116/70 | HR 69 | Temp 97.6°F | Ht 63.0 in | Wt 198.2 lb

## 2018-12-05 DIAGNOSIS — Z860100 Personal history of colon polyps, unspecified: Secondary | ICD-10-CM

## 2018-12-05 DIAGNOSIS — E7800 Pure hypercholesterolemia, unspecified: Secondary | ICD-10-CM

## 2018-12-05 DIAGNOSIS — Z79899 Other long term (current) drug therapy: Secondary | ICD-10-CM

## 2018-12-05 DIAGNOSIS — F331 Major depressive disorder, recurrent, moderate: Secondary | ICD-10-CM | POA: Diagnosis not present

## 2018-12-05 DIAGNOSIS — Z87891 Personal history of nicotine dependence: Secondary | ICD-10-CM

## 2018-12-05 DIAGNOSIS — Z720 Tobacco use: Secondary | ICD-10-CM

## 2018-12-05 DIAGNOSIS — Z6835 Body mass index (BMI) 35.0-35.9, adult: Secondary | ICD-10-CM

## 2018-12-05 MED ORDER — LIVALO 2 MG PO TABS: ORAL_TABLET | ORAL | 2 refills | Status: DC

## 2018-12-05 NOTE — Progress Notes (Signed)
Subjective:     Patient ID: TASHAY BOZICH , female    DOB: October 06, 1961 , 58 y.o.   MRN: [id number]   Chief Complaint  Patient presents with  . Depression    HPI  Depression       The patient presents with depression.  This is a chronic problem.  The current episode started more than 1 year ago.   The onset quality is gradual.   The problem occurs intermittently.  Past treatments include SSRIs - Selective serotonin reuptake inhibitors.  Compliance with treatment is good.  Past medical history includes chronic illness and depression.    She adds that she lacks motivation. Wants to continue with Pristiq.   Past Medical History:  Diagnosis Date  . Anxiety   . Arthritis   . Chest pain, unspecified   . Dermatitis   . HLD (hyperlipidemia)    no current med.  . Insomnia   . Loss of hair   . Low back pain   . Major depressive disorder, single episode, mild ([place])   . Personal history of colonic polyps   . Shoulder impingement 06/2012   left  . Vitamin D deficiency   . Wears dentures    upper denture  . Wears partial dentures    lower partial     Family History  Problem Relation Age of Onset  . Heart attack Other   . Hypertension Other   . Diabetes Other   . COPD Other   . COPD Mother   . Heart disease Mother   . Diabetes Mother   . Depression Mother   . Hypothyroidism Father      Current Outpatient Medications:  .  Cholecalciferol (D2000 ULTRA STRENGTH) 2000 units CAPS, Take by mouth., Disp: , Rfl:  .  desvenlafaxine (PRISTIQ) 100 MG 24 hr tablet, TAKE 1 TABLET BY MOUTH EVERY DAY. NEEDS APPOINTMENT, Disp: 30 tablet, Rfl: 2 .  DESVENLAFAXINE ER PO, Take by mouth., Disp: , Rfl:  .  LIVALO 2 MG TABS, TK 1 T PO QD, Disp: 30 tablet, Rfl: 2 .  naproxen sodium (ALEVE) 220 MG tablet, Take 220 mg by mouth., Disp: , Rfl:  .  temazepam (RESTORIL) 30 MG capsule, Take 1 capsule (30 mg total) by mouth at bedtime as needed for sleep., Disp: 30 capsule, Rfl: 2   Allergies  Allergen  Reactions  . Doxycycline Itching  . Penicillins Hives and Swelling    SWELLING OF TONGUE SWELLING OF JOINTS  . Rosuvastatin Other (See Comments)    MUSCLE ACHES     Review of Systems  Constitutional: Negative.   Respiratory: Negative.   Cardiovascular: Negative.   Gastrointestinal: Negative.   Neurological: Negative.   Psychiatric/Behavioral: Positive for depression and dysphoric mood.     Today's Vitals   12/05/18 1522  BP: 116/70  Pulse: 69  Temp: 97.6 F (36.4 C)  TempSrc: Oral  SpO2: 98%  Weight: 198 lb 3.2 oz (89.9 kg)  Height: 5\' 3"  (1.6 m)  PainSc: 6   PainLoc: Back   Body mass index is 35.11 kg/m.   Objective:  Physical Exam Vitals signs and nursing note reviewed.  Constitutional:      Appearance: Normal appearance. She is obese.  HENT:     Head: Normocephalic and atraumatic.  Cardiovascular:     Rate and Rhythm: Normal rate and regular rhythm.     Heart sounds: Normal heart sounds.  Pulmonary:     Effort: Pulmonary effort is normal.  Breath sounds: Normal breath sounds.  Skin:    General: Skin is warm.  Neurological:     General: No focal deficit present.     Mental Status: She is alert.  Psychiatric:        Mood and Affect: Mood normal.         Assessment And Plan:     1. Moderate episode of recurrent major depressive disorder ([place])  She will continue with current meds. I will also refer her for counseling.   - Ambulatory referral to Psychology  2. Pure hypercholesterolemia  She is encouraged to take meds daily. She is also encouraged to avoid fried foods and increase her fish intake.   - TSH  3. Class 2 severe obesity due to excess calories with serious comorbidity and body mass index (BMI) of 35.0 to 35.9 in adult [facility])  She is encouraged to strive for BMI less than 30 to decrease cardiac risk. She is encouraged to increase her daily activity. She is advised to start with 10 minute increments and gradually work up to 30 minutes  five days weekly.   4. History of colonic polyps  Her last colonoscopy was five years ago. I will refer her to GI for repeat CRC screening.   - Ambulatory referral to Gastroenterology  5. Drug therapy  - Hepatitis C antibody - HIV antibody (with reflex)  6. History of tobacco use disorder  I will refer her for low dose CT scan. She qualifies- she reports smoking from age 3 into her 67s with 1-2 ppd.   Maximino Greenland, MD

## 2018-12-05 NOTE — Patient Instructions (Signed)

## 2018-12-06 LAB — TSH: TSH: 0.822 u[IU]/mL (ref 0.450–4.500)

## 2018-12-06 LAB — HEPATITIS C ANTIBODY: Hep C Virus Ab: 0.1 s/co ratio (ref 0.0–0.9)

## 2018-12-06 LAB — HIV ANTIBODY (ROUTINE TESTING W REFLEX): HIV Screen 4th Generation wRfx: NONREACTIVE

## 2018-12-26 ENCOUNTER — Ambulatory Visit (INDEPENDENT_AMBULATORY_CARE_PROVIDER_SITE_OTHER): Payer: Medicare Other | Admitting: Psychology

## 2018-12-26 DIAGNOSIS — F3289 Other specified depressive episodes: Secondary | ICD-10-CM

## 2018-12-29 ENCOUNTER — Ambulatory Visit
Admission: RE | Admit: 2018-12-29 | Discharge: 2018-12-29 | Disposition: A | Discharge status: Home / Self Care | Payer: Medicare Other | Source: Ambulatory Visit | Attending: Internal Medicine | Admitting: Internal Medicine

## 2018-12-29 ENCOUNTER — Inpatient Hospital Stay: Admission: RE | Admit: 2018-12-29 | Payer: Self-pay | Source: Ambulatory Visit

## 2018-12-29 DIAGNOSIS — Z720 Tobacco use: Secondary | ICD-10-CM

## 2019-01-05 ENCOUNTER — Other Ambulatory Visit: Payer: Self-pay | Admitting: Internal Medicine

## 2019-01-09 ENCOUNTER — Encounter: Payer: Self-pay | Admitting: Internal Medicine

## 2019-01-09 ENCOUNTER — Ambulatory Visit: Payer: Medicare Other | Admitting: Psychology

## 2019-01-09 NOTE — Telephone Encounter (Signed)
Temazepam refill 

## 2019-01-11 ENCOUNTER — Encounter: Payer: Self-pay | Admitting: Internal Medicine

## 2019-01-15 ENCOUNTER — Other Ambulatory Visit: Payer: Self-pay | Admitting: Internal Medicine

## 2019-01-18 ENCOUNTER — Ambulatory Visit (INDEPENDENT_AMBULATORY_CARE_PROVIDER_SITE_OTHER): Payer: Medicare Other | Admitting: Psychology

## 2019-01-18 DIAGNOSIS — F3289 Other specified depressive episodes: Secondary | ICD-10-CM | POA: Diagnosis not present

## 2019-02-01 ENCOUNTER — Ambulatory Visit (INDEPENDENT_AMBULATORY_CARE_PROVIDER_SITE_OTHER): Payer: Medicare Other | Admitting: Psychology

## 2019-02-01 DIAGNOSIS — F3289 Other specified depressive episodes: Secondary | ICD-10-CM | POA: Diagnosis not present

## 2019-02-05 ENCOUNTER — Encounter: Payer: Self-pay | Admitting: Internal Medicine

## 2019-02-15 ENCOUNTER — Ambulatory Visit (INDEPENDENT_AMBULATORY_CARE_PROVIDER_SITE_OTHER): Payer: Medicare Other | Admitting: Psychology

## 2019-02-15 DIAGNOSIS — F3289 Other specified depressive episodes: Secondary | ICD-10-CM | POA: Diagnosis not present

## 2019-03-02 ENCOUNTER — Ambulatory Visit (INDEPENDENT_AMBULATORY_CARE_PROVIDER_SITE_OTHER): Payer: Medicare Other | Admitting: Psychology

## 2019-03-02 DIAGNOSIS — F3289 Other specified depressive episodes: Secondary | ICD-10-CM

## 2019-03-11 ENCOUNTER — Other Ambulatory Visit: Payer: Self-pay | Admitting: Internal Medicine

## 2019-03-29 ENCOUNTER — Ambulatory Visit: Payer: Medicare Other | Admitting: Psychology

## 2019-04-12 ENCOUNTER — Ambulatory Visit (INDEPENDENT_AMBULATORY_CARE_PROVIDER_SITE_OTHER): Payer: Medicare Other

## 2019-04-12 ENCOUNTER — Other Ambulatory Visit: Payer: Self-pay

## 2019-04-12 VITALS — Ht 63.0 in | Wt 177.0 lb

## 2019-04-12 DIAGNOSIS — Z0000 Encounter for general adult medical examination without abnormal findings: Secondary | ICD-10-CM | POA: Diagnosis not present
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
## 2019-04-12 NOTE — Patient Instructions (Signed)
[name] , Thank you for taking time to come for your Medicare Wellness Visit. I appreciate your ongoing commitment to your health goals. Please review the following plan we discussed and let me know if I can assist you in the future.   Screening recommendations/referrals: Colonoscopy: 08/2013 Mammogram: 10/2018 Bone Density: n/a Recommended yearly ophthalmology/optometry visit for glaucoma screening and checkup Recommended yearly dental visit for hygiene and checkup  Vaccinations: Influenza vaccine: 08/2018 Pneumococcal vaccine: n/a Tdap vaccine: 07/2010 Shingles vaccine: discussed    Advanced directives: Please bring a copy of your POA (Power of [place]) and/or Living Will to your next appointment.    Conditions/risks identified: obesity  Next appointment: 06/21/2019 at 3:00  Preventive Care 40-64 Years, Female Preventive care refers to lifestyle choices and visits with your health care provider that can promote health and wellness. What does preventive care include?  A yearly physical exam. This is also called an annual well check.  Dental exams once or twice a year.  Routine eye exams. Ask your health care provider how often you should have your eyes checked.  Personal lifestyle choices, including:  Daily care of your teeth and gums.  Regular physical activity.  Eating a healthy diet.  Avoiding tobacco and drug use.  Limiting alcohol use.  Practicing safe sex.  Taking low-dose aspirin daily starting at age 48.  Taking vitamin and mineral supplements as recommended by your health care provider. What happens during an annual well check? The services and screenings done by your health care provider during your annual well check will depend on your age, overall health, lifestyle risk factors, and family history of disease. Counseling  Your health care provider may ask you questions about your:  Alcohol use.  Tobacco use.  Drug use.  Emotional well-being.   Home and relationship well-being.  Sexual activity.  Eating habits.  Work and work Statistician.  Method of birth control.  Menstrual cycle.  Pregnancy history. Screening  You may have the following tests or measurements:  Height, weight, and BMI.  Blood pressure.  Lipid and cholesterol levels. These may be checked every 5 years, or more frequently if you are over 69 years old.  Skin check.  Lung cancer screening. You may have this screening every year starting at age 36 if you have a 30-pack-year history of smoking and currently smoke or have quit within the past 15 years.  Fecal occult blood test (FOBT) of the stool. You may have this test every year starting at age 59.  Flexible sigmoidoscopy or colonoscopy. You may have a sigmoidoscopy every 5 years or a colonoscopy every 10 years starting at age 36.  Hepatitis C blood test.  Hepatitis B blood test.  Sexually transmitted disease (STD) testing.  Diabetes screening. This is done by checking your blood sugar (glucose) after you have not eaten for a while (fasting). You may have this done every 1-3 years.  Mammogram. This may be done every 1-2 years. Talk to your health care provider about when you should start having regular mammograms. This may depend on whether you have a family history of breast cancer.  BRCA-related cancer screening. This may be done if you have a family history of breast, ovarian, tubal, or peritoneal cancers.  Pelvic exam and Pap test. This may be done every 3 years starting at age 42. Starting at age 67, this may be done every 5 years if you have a Pap test in combination with an HPV test.  Bone density scan.  This is done to screen for osteoporosis. You may have this scan if you are at high risk for osteoporosis. Discuss your test results, treatment options, and if necessary, the need for more tests with your health care provider. Vaccines  Your health care provider may recommend certain  vaccines, such as:  Influenza vaccine. This is recommended every year.  Tetanus, diphtheria, and acellular pertussis (Tdap, Td) vaccine. You may need a Td booster every 10 years.  Zoster vaccine. You may need this after age 82.  Pneumococcal 13-valent conjugate (PCV13) vaccine. You may need this if you have certain conditions and were not previously vaccinated.  Pneumococcal polysaccharide (PPSV23) vaccine. You may need one or two doses if you smoke cigarettes or if you have certain conditions. Talk to your health care provider about which screenings and vaccines you need and how often you need them. This information is not intended to replace advice given to you by your health care provider. Make sure you discuss any questions you have with your health care provider. Document Released: 11/29/2015 Document Revised: 07/22/2016 Document Reviewed: 09/03/2015 Elsevier Interactive Patient Education  2017 [place] Prevention in the Home Falls can cause injuries. They can happen to people of all ages. There are many things you can do to make your home safe and to help prevent falls. What can I do on the outside of my home?  Regularly fix the edges of walkways and driveways and fix any cracks.  Remove anything that might make you trip as you walk through a door, such as a raised step or threshold.  Trim any bushes or trees on the path to your home.  Use bright outdoor lighting.  Clear any walking paths of anything that might make someone trip, such as rocks or tools.  Regularly check to see if handrails are loose or broken. Make sure that both sides of any steps have handrails.  Any raised decks and porches should have guardrails on the edges.  Have any leaves, snow, or ice cleared regularly.  Use sand or salt on walking paths during winter.  Clean up any spills in your garage right away. This includes oil or grease spills. What can I do in the bathroom?  Use night  lights.  Install grab bars by the toilet and in the tub and shower. Do not use towel bars as grab bars.  Use non-skid mats or decals in the tub or shower.  If you need to sit down in the shower, use a plastic, non-slip stool.  Keep the floor dry. Clean up any water that spills on the floor as soon as it happens.  Remove soap buildup in the tub or shower regularly.  Attach bath mats securely with double-sided non-slip rug tape.  Do not have throw rugs and other things on the floor that can make you trip. What can I do in the bedroom?  Use night lights.  Make sure that you have a light by your bed that is easy to reach.  Do not use any sheets or blankets that are too big for your bed. They should not hang down onto the floor.  Have a firm chair that has side arms. You can use this for support while you get dressed.  Do not have throw rugs and other things on the floor that can make you trip. What can I do in the kitchen?  Clean up any spills right away.  Avoid walking on wet floors.  Keep items that you use a lot in easy-to-reach places.  If you need to reach something above you, use a strong step stool that has a grab bar.  Keep electrical cords out of the way.  Do not use floor polish or wax that makes floors slippery. If you must use wax, use non-skid floor wax.  Do not have throw rugs and other things on the floor that can make you trip. What can I do with my stairs?  Do not leave any items on the stairs.  Make sure that there are handrails on both sides of the stairs and use them. Fix handrails that are broken or loose. Make sure that handrails are as long as the stairways.  Check any carpeting to make sure that it is firmly attached to the stairs. Fix any carpet that is loose or worn.  Avoid having throw rugs at the top or bottom of the stairs. If you do have throw rugs, attach them to the floor with carpet tape.  Make sure that you have a light switch at the  top of the stairs and the bottom of the stairs. If you do not have them, ask someone to add them for you. What else can I do to help prevent falls?  Wear shoes that:  Do not have high heels.  Have rubber bottoms.  Are comfortable and fit you well.  Are closed at the toe. Do not wear sandals.  If you use a stepladder:  Make sure that it is fully opened. Do not climb a closed stepladder.  Make sure that both sides of the stepladder are locked into place.  Ask someone to hold it for you, if possible.  Clearly mark and make sure that you can see:  Any grab bars or handrails.  First and last steps.  Where the edge of each step is.  Use tools that help you move around (mobility aids) if they are needed. These include:  Canes.  Walkers.  Scooters.  Crutches.  Turn on the lights when you go into a dark area. Replace any light bulbs as soon as they burn out.  Set up your furniture so you have a clear path. Avoid moving your furniture around.  If any of your floors are uneven, fix them.  If there are any pets around you, be aware of where they are.  Review your medicines with your doctor. Some medicines can make you feel dizzy. This can increase your chance of falling. Ask your doctor what other things that you can do to help prevent falls. This information is not intended to replace advice given to you by your health care provider. Make sure you discuss any questions you have with your health care provider. Document Released: 08/29/2009 Document Revised: 04/09/2016 Document Reviewed: 12/07/2014 Elsevier Interactive Patient Education  2017 [employer].

## 2019-04-12 NOTE — Progress Notes (Signed)
Subjective:   Virginia [name] is a 58 y.o. female who presents for Medicare Annual (Subsequent) preventive examination.  This visit type was conducted due to national recommendations for restrictions regarding the COVID-19 Pandemic (e.g. social distancing). This format is felt to be most appropriate for this patient at this time. All issues noted in this document were discussed and addressed. No physical exam was performed (except for noted visual exam findings with Video Visits). This patient, Virginia [name], has given permission to perform this visit via telephone. Vital signs may be absent or patient reported.  Patient location:  At home  Nurse location:  [place] office     Review of Systems:  n/a Cardiac Risk Factors include: sedentary lifestyle;obesity (BMI >30kg/m2)     Objective:     Vitals: Ht 5\' 3"  (1.6 m) Comment: per patient  Wt 177 lb (80.3 kg) Comment: per patient  BMI 31.35 kg/m   Body mass index is 31.35 kg/m.  Advanced Directives 04/12/2019 07/14/2012  Does Patient Have a Medical Advance Directive? Yes Patient does not have advance directive;Patient would not like information  Type of Scientist, forensic Power of [place];Living will -  Copy of [place] in Chart? No - copy requested -    Tobacco Social History   Tobacco Use  Smoking Status Former Smoker  . Packs/day: 2.00  . Years: 40.00  . Pack years: 80.00  Smokeless Tobacco Never Used  Tobacco Comment   quit smoking 02/2012     Counseling given: Not Answered Comment: quit smoking 02/2012   Clinical Intake:  Pre-visit preparation completed: Yes  Pain : 0-10 Pain Score: 5  Pain Type: Chronic pain Pain Location: Back Pain Orientation: Lower Pain Radiating Towards: goes left leg Pain Descriptors / Indicators: Dull Pain Onset: More than a month ago Pain Frequency: Constant Pain Relieving Factors: heat and advil helps  Pain Relieving Factors: heat and advil helps  Nutritional Status: BMI > 30  Obese Diabetes: No  How often do you need to have someone help you when you read instructions, pamphlets, or other written materials from your doctor or pharmacy?: 1 - Never What is the last grade level you completed in school?: GED      Information entered by :: NAllen LPN  Past Medical History:  Diagnosis Date  . Anxiety   . Arthritis   . Chest pain, unspecified   . Dermatitis   . HLD (hyperlipidemia)    no current med.  . Insomnia   . Loss of hair   . Low back pain   . Major depressive disorder, single episode, mild ([place])   . Personal history of colonic polyps   . Shoulder impingement 06/2012   left  . Vitamin D deficiency   . Wears dentures    upper denture  . Wears partial dentures    lower partial   Past Surgical History:  Procedure Laterality Date  . ABDOMINAL HYSTERECTOMY  1990s   partial  . APPENDECTOMY  06/08/2006  . CARPAL TUNNEL RELEASE  01/14/2005   left; with left cubital tunnel release  . EXPLORATORY LAPAROTOMY WITH ABDOMINAL MASS EXCISION  06/08/2006   resection right ovarian mass, partial omentectomy  . FULKERSON SLIDE  10/19/2007   right knee  . KNEE ARTHROSCOPY  05/28/2010   left  . KNEE ARTHROSCOPY     right  . SALPINGOOPHORECTOMY  06/08/2006   bilat.  Marland Kitchen TOTAL KNEE ARTHROPLASTY  05/04/2011   left   Family History  Problem Relation Age of Onset  . Heart attack Other   . Hypertension Other   . Diabetes Other   . COPD Other   . COPD Mother   . Heart disease Mother   . Diabetes Mother   . Depression Mother   . Hypothyroidism Father    Social History   Socioeconomic History  . Marital status: Single    Spouse name: Not on file  . Number of children: Not on file  . Years of education: Not on file  . Highest education level: Not on file  Occupational History  . Occupation: disability  Social Needs  . Financial resource strain: Not hard at all  . Food insecurity:    Worry: Never true    Inability: Never  true  . Transportation needs:    Medical: No    Non-medical: No  Tobacco Use  . Smoking status: Former Smoker    Packs/day: 2.00    Years: 40.00    Pack years: 80.00  . Smokeless tobacco: Never Used  . Tobacco comment: quit smoking 02/2012  Substance and Sexual Activity  . Alcohol use: Yes    Comment: seldom  . Drug use: No  . Sexual activity: Not Currently  Lifestyle  . Physical activity:    Days per week: 0 days    Minutes per session: 0 min  . Stress: Not at all  Relationships  . Social connections:    Talks on phone: Not on file    Gets together: Not on file    Attends religious service: Not on file    Active member of club or organization: Not on file    Attends meetings of clubs or organizations: Not on file    Relationship status: Not on file  Other Topics Concern  . Not on file  Social History Narrative  . Not on file    Outpatient Encounter Medications as of 04/12/2019  Medication Sig  . ARIPiprazole (ABILIFY) 2 MG tablet Take 2 mg by mouth daily.  . Cholecalciferol (D2000 ULTRA STRENGTH) 2000 units CAPS Take by mouth.  . desvenlafaxine (PRISTIQ) 100 MG 24 hr tablet TAKE 1 TABLET BY MOUTH EVERY DAY. NEEDS APPOINTMENT  . DESVENLAFAXINE ER PO Take by mouth.  Marland Kitchen LIVALO 2 MG TABS TAKE 1 TABLET BY MOUTH EVERY DAY  . naproxen sodium (ALEVE) 220 MG tablet Take 220 mg by mouth.  . zolpidem (AMBIEN) 10 MG tablet Take 10 mg by mouth at bedtime.  . temazepam (RESTORIL) 30 MG capsule One capsule po qhs prn (Patient not taking: Reported on 04/12/2019)   No facility-administered encounter medications on file as of 04/12/2019.     Activities of Daily Living In your present state of health, do you have any difficulty performing the following activities: 04/12/2019  Hearing? N  Vision? N  Difficulty concentrating or making decisions? N  Walking or climbing stairs? N  Dressing or bathing? N  Doing errands, shopping? N  Preparing Food and eating ? N  Using the Toilet? N  In  the past six months, have you accidently leaked urine? Y  Do you have problems with loss of bowel control? N  Managing your Medications? N  Managing your Finances? N  Housekeeping or managing your Housekeeping? N  Some recent data might be hidden    Patient Care Team: Glendale Chard, MD as PCP - General (Internal Medicine)    Assessment:   This is a routine wellness examination for [place].  Exercise Activities and Dietary  recommendations Current Exercise Habits: The patient does not participate in regular exercise at present  Goals    . Patient Stated     Decrease cholesterol    . Weight (lb) < 200 lb (90.7 kg)     Wants to weigh 136 pounds       Fall Risk Fall Risk  04/12/2019 09/01/2018  Falls in the past year? 0 No  Follow up Falls prevention discussed -   Is the patient's home free of loose throw rugs in walkways, pet beds, electrical cords, etc?   yes      Grab bars in the bathroom? no      Handrails on the stairs?   yes      Adequate lighting?   yes  Timed Get Up and Go performed: n/a  Depression Screen PHQ 2/9 Scores 04/12/2019 12/05/2018 09/01/2018  PHQ - 2 Score 0 5 4  PHQ- 9 Score 1 17 14      Cognitive Function     6CIT Screen 04/12/2019  What Year? 0 points  What month? 0 points  What time? 0 points  Count back from 20 0 points  Months in reverse 0 points  Repeat phrase 0 points  Total Score 0    Immunization History  Administered Date(s) Administered  . DTaP 08/12/2010    Qualifies for Shingles Vaccine? yes  Screening Tests Health Maintenance  Topic Date Due  . PAP SMEAR-Modifier  07/23/2014  . INFLUENZA VACCINE  06/17/2019  . TETANUS/TDAP  08/12/2020  . MAMMOGRAM  10/31/2020  . COLONOSCOPY  09/02/2023  . Hepatitis C Screening  Completed  . HIV Screening  Completed    Cancer Screenings: Lung: Low Dose CT Chest recommended if Age 50-80 years, 30 pack-year currently smoking OR have quit w/in 15years. Patient does not qualify. Breast:   Up to date on Mammogram? Yes   Up to date of Bone Density/Dexa?n/a Colorectal: due  Additional Screenings: Hepatitis C Screening: 12/05/2018     Plan:   Wants to weigh 136 pounds  I have personally reviewed and noted the following in the patient's chart:   . Medical and social history . Use of alcohol, tobacco or illicit drugs  . Current medications and supplements . Functional ability and status . Nutritional status . Physical activity . Advanced directives . List of other physicians . Hospitalizations, surgeries, and ER visits in previous 12 months . Vitals . Screenings to include cognitive, depression, and falls . Referrals and appointments  In addition, I have reviewed and discussed with patient certain preventive protocols, quality metrics, and best practice recommendations. A written personalized care plan for preventive services as well as general preventive health recommendations were provided to patient.     Kellie Simmering, LPN  02/15/271

## 2019-06-08 ENCOUNTER — Ambulatory Visit: Payer: Medicare Other

## 2019-06-21 ENCOUNTER — Encounter: Payer: Medicare Other | Admitting: Internal Medicine

## 2019-09-04 ENCOUNTER — Telehealth: Payer: Self-pay

## 2019-09-04 NOTE — Telephone Encounter (Signed)
PT LVM THAT SHE HAS BEEN TRYING TO CONTACT [facility] ABOUT APPT. ATT TO CONTACT PT AT [id number] NO ANS LVM TO CALL [facility]

## 2019-09-20 ENCOUNTER — Other Ambulatory Visit: Payer: Self-pay

## 2019-09-20 DIAGNOSIS — Z79810 Long term (current) use of selective estrogen receptor modulators (SERMs): Secondary | ICD-10-CM

## 2019-09-20 DIAGNOSIS — E785 Hyperlipidemia, unspecified: Secondary | ICD-10-CM

## 2019-09-20 DIAGNOSIS — R739 Hyperglycemia, unspecified: Secondary | ICD-10-CM

## 2019-09-20 DIAGNOSIS — Z79899 Other long term (current) drug therapy: Secondary | ICD-10-CM

## 2019-09-25 ENCOUNTER — Other Ambulatory Visit: Payer: Self-pay

## 2019-09-25 MED ORDER — LIVALO 2 MG PO TABS: ORAL_TABLET | ORAL | 0 refills | Status: DC

## 2019-09-27 ENCOUNTER — Other Ambulatory Visit: Payer: Medicare Other

## 2019-09-27 ENCOUNTER — Other Ambulatory Visit: Payer: Self-pay

## 2019-09-27 DIAGNOSIS — Z79899 Other long term (current) drug therapy: Secondary | ICD-10-CM | POA: Diagnosis not present

## 2019-09-27 LAB — CBC
Hematocrit: 40.7 % (ref 34.0–46.6)
Hemoglobin: 13.7 g/dL (ref 11.1–15.9)
MCH: 30.6 pg (ref 26.6–33.0)
MCHC: 33.7 g/dL (ref 31.5–35.7)
MCV: 91 fL (ref 79–97)
Platelets: 239 10*3/uL (ref 150–450)
RBC: 4.47 x10E6/uL (ref 3.77–5.28)
RDW: 12.6 % (ref 11.7–15.4)
WBC: 6.8 10*3/uL (ref 3.4–10.8)

## 2019-09-27 LAB — LIPID PANEL
Chol/HDL Ratio: 4.2 ratio (ref 0.0–4.4)
Cholesterol, Total: 253 mg/dL — ABNORMAL HIGH (ref 100–199)
HDL: 60 mg/dL (ref >39)
LDL Chol Calc (NIH): 170 mg/dL — ABNORMAL HIGH (ref 0–99)
Triglycerides: 129 mg/dL (ref 0–149)
VLDL Cholesterol Cal: 23 mg/dL (ref 5–40)

## 2019-09-27 LAB — CMP14+EGFR
ALT: 28 IU/L (ref 0–32)
AST: 24 IU/L (ref 0–40)
Albumin/Globulin Ratio: 2 (ref 1.2–2.2)
Albumin: 4.5 g/dL (ref 3.8–4.9)
Alkaline Phosphatase: 86 IU/L (ref 39–117)
BUN/Creatinine Ratio: 22 (ref 9–23)
BUN: 20 mg/dL (ref 6–24)
Bilirubin Total: 0.3 mg/dL (ref 0.0–1.2)
CO2: 22 mmol/L (ref 20–29)
Calcium: 9.8 mg/dL (ref 8.7–10.2)
Chloride: 102 mmol/L (ref 96–106)
Creatinine, Ser: 0.91 mg/dL (ref 0.57–1.00)
GFR calc Af Amer: 81 mL/min/{1.73_m2} (ref >59)
GFR calc non Af Amer: 70 mL/min/{1.73_m2} (ref >59)
Globulin, Total: 2.2 g/dL (ref 1.5–4.5)
Glucose: 95 mg/dL (ref 65–99)
Potassium: 4.6 mmol/L (ref 3.5–5.2)
Sodium: 141 mmol/L (ref 134–144)
Total Protein: 6.7 g/dL (ref 6.0–8.5)

## 2019-09-27 LAB — HEMOGLOBIN A1C
Est. average glucose Bld gHb Est-mCnc: 108 mg/dL
Hgb A1c MFr Bld: 5.4 % (ref 4.8–5.6)

## 2019-10-01 ENCOUNTER — Encounter: Payer: Self-pay | Admitting: Internal Medicine

## 2019-10-09 ENCOUNTER — Encounter: Payer: Self-pay | Admitting: Internal Medicine

## 2019-10-09 ENCOUNTER — Other Ambulatory Visit: Payer: Self-pay | Admitting: Internal Medicine

## 2019-10-09 ENCOUNTER — Other Ambulatory Visit: Payer: Self-pay

## 2019-10-09 MED ORDER — CLINDAMYCIN HCL 300 MG PO CAPS: ORAL_CAPSULE | ORAL | 1 refills | Status: DC

## 2019-10-10 ENCOUNTER — Other Ambulatory Visit: Payer: Self-pay

## 2019-11-06 ENCOUNTER — Ambulatory Visit (INDEPENDENT_AMBULATORY_CARE_PROVIDER_SITE_OTHER): Payer: Medicare Other | Admitting: Internal Medicine

## 2019-11-06 ENCOUNTER — Encounter: Payer: Self-pay | Admitting: Internal Medicine

## 2019-11-06 ENCOUNTER — Other Ambulatory Visit: Payer: Self-pay

## 2019-11-06 VITALS — BP 114/70 | HR 98 | Temp 97.7°F | Ht 63.0 in | Wt 195.6 lb

## 2019-11-06 DIAGNOSIS — E7800 Pure hypercholesterolemia, unspecified: Secondary | ICD-10-CM | POA: Diagnosis not present

## 2019-11-06 DIAGNOSIS — Z0000 Encounter for general adult medical examination without abnormal findings: Secondary | ICD-10-CM | POA: Diagnosis not present

## 2019-11-06 DIAGNOSIS — F324 Major depressive disorder, single episode, in partial remission: Secondary | ICD-10-CM

## 2019-11-06 DIAGNOSIS — Z87891 Personal history of nicotine dependence: Secondary | ICD-10-CM

## 2019-11-06 DIAGNOSIS — E6609 Other obesity due to excess calories: Secondary | ICD-10-CM | POA: Diagnosis not present

## 2019-11-06 DIAGNOSIS — Z6834 Body mass index (BMI) 34.0-34.9, adult: Secondary | ICD-10-CM

## 2019-11-06 DIAGNOSIS — I2510 Atherosclerotic heart disease of native coronary artery without angina pectoris: Secondary | ICD-10-CM

## 2019-11-06 DIAGNOSIS — Z712 Person consulting for explanation of examination or test findings: Secondary | ICD-10-CM | POA: Diagnosis not present

## 2019-11-06 DIAGNOSIS — Z1231 Encounter for screening mammogram for malignant neoplasm of breast: Secondary | ICD-10-CM

## 2019-11-06 DIAGNOSIS — Z23 Encounter for immunization: Secondary | ICD-10-CM | POA: Diagnosis not present

## 2019-11-06 LAB — POCT URINALYSIS DIPSTICK
Bilirubin, UA: NEGATIVE
Blood, UA: NEGATIVE
Glucose, UA: NEGATIVE
Ketones, UA: NEGATIVE
Nitrite, UA: NEGATIVE
Protein, UA: NEGATIVE
Spec Grav, UA: 1.015 (ref 1.010–1.025)
Urobilinogen, UA: 0.2 E.U./dL
pH, UA: 6.5 (ref 5.0–8.0)

## 2019-11-06 MED ORDER — ATORVASTATIN CALCIUM 40 MG PO TABS: ORAL_TABLET | ORAL | 1 refills | Status: DC

## 2019-11-06 NOTE — Patient Instructions (Signed)
Health Maintenance, Female Adopting a healthy lifestyle and getting preventive care are important in promoting health and wellness. Ask your health care provider about:  The right schedule for you to have regular tests and exams.  Things you can do on your own to prevent diseases and keep yourself healthy. What should I know about diet, weight, and exercise? Eat a healthy diet   Eat a diet that includes plenty of vegetables, fruits, low-fat dairy products, and lean protein.  Do not eat a lot of foods that are high in solid fats, added sugars, or sodium. Maintain a healthy weight Body mass index (BMI) is used to identify weight problems. It estimates body fat based on height and weight. Your health care provider can help determine your BMI and help you achieve or maintain a healthy weight. Get regular exercise Get regular exercise. This is one of the most important things you can do for your health. Most adults should:  Exercise for at least 150 minutes each week. The exercise should increase your heart rate and make you sweat (moderate-intensity exercise).  Do strengthening exercises at least twice a week. This is in addition to the moderate-intensity exercise.  Spend less time sitting. Even light physical activity can be beneficial. Watch cholesterol and blood lipids Have your blood tested for lipids and cholesterol at 57 years of age, then have this test every 5 years. Have your cholesterol levels checked more often if:  Your lipid or cholesterol levels are high.  You are older than 57 years of age.  You are at high risk for heart disease. What should I know about cancer screening? Depending on your health history and family history, you may need to have cancer screening at various ages. This may include screening for:  Breast cancer.  Cervical cancer.  Colorectal cancer.  Skin cancer.  Lung cancer. What should I know about heart disease, diabetes, and high blood  pressure? Blood pressure and heart disease  High blood pressure causes heart disease and increases the risk of stroke. This is more likely to develop in people who have high blood pressure readings, are of African descent, or are overweight.  Have your blood pressure checked: ? Every 3-5 years if you are 18-39 years of age. ? Every year if you are 40 years old or older. Diabetes Have regular diabetes screenings. This checks your fasting blood sugar level. Have the screening done:  Once every three years after age 40 if you are at a normal weight and have a low risk for diabetes.  More often and at a younger age if you are overweight or have a high risk for diabetes. What should I know about preventing infection? Hepatitis B If you have a higher risk for hepatitis B, you should be screened for this virus. Talk with your health care provider to find out if you are at risk for hepatitis B infection. Hepatitis C Testing is recommended for:  Everyone born from 1945 through 1965.  Anyone with known risk factors for hepatitis C. Sexually transmitted infections (STIs)  Get screened for STIs, including gonorrhea and chlamydia, if: ? You are sexually active and are younger than 57 years of age. ? You are older than 57 years of age and your health care provider tells you that you are at risk for this type of infection. ? Your sexual activity has changed since you were last screened, and you are at increased risk for chlamydia or gonorrhea. Ask your health care provider if   you are at risk.  Ask your health care provider about whether you are at high risk for HIV. Your health care provider may recommend a prescription medicine to help prevent HIV infection. If you choose to take medicine to prevent HIV, you should first get tested for HIV. You should then be tested every 3 months for as long as you are taking the medicine. Pregnancy  If you are about to stop having your period (premenopausal) and  you may become pregnant, seek counseling before you get pregnant.  Take 400 to 800 micrograms (mcg) of folic acid every day if you become pregnant.  Ask for birth control (contraception) if you want to prevent pregnancy. Osteoporosis and menopause Osteoporosis is a disease in which the bones lose minerals and strength with aging. This can result in bone fractures. If you are 65 years old or older, or if you are at risk for osteoporosis and fractures, ask your health care provider if you should:  Be screened for bone loss.  Take a calcium or vitamin D supplement to lower your risk of fractures.  Be given hormone replacement therapy (HRT) to treat symptoms of menopause. Follow these instructions at home: Lifestyle  Do not use any products that contain nicotine or tobacco, such as cigarettes, e-cigarettes, and chewing tobacco. If you need help quitting, ask your health care provider.  Do not use street drugs.  Do not share needles.  Ask your health care provider for help if you need support or information about quitting drugs. Alcohol use  Do not drink alcohol if: ? Your health care provider tells you not to drink. ? You are pregnant, may be pregnant, or are planning to become pregnant.  If you drink alcohol: ? Limit how much you use to 0-1 drink a day. ? Limit intake if you are breastfeeding.  Be aware of how much alcohol is in your drink. In the [place] one drink equals one 12 oz bottle of beer (355 mL), one 5 oz glass of wine (148 mL), or one 1 oz glass of hard liquor (44 mL). General instructions  Schedule regular health, dental, and eye exams.  Stay current with your vaccines.  Tell your health care provider if: ? You often feel depressed. ? You have ever been abused or do not feel safe at home. Summary  Adopting a healthy lifestyle and getting preventive care are important in promoting health and wellness.  Follow your health care provider's instructions about healthy  diet, exercising, and getting tested or screened for diseases.  Follow your health care provider's instructions on monitoring your cholesterol and blood pressure. This information is not intended to replace advice given to you by your health care provider. Make sure you discuss any questions you have with your health care provider. Document Released: 05/18/2011 Document Revised: 10/26/2018 Document Reviewed: 10/26/2018 Elsevier Patient Education  2020 Elsevier Inc.  

## 2019-11-06 NOTE — Progress Notes (Signed)
This visit occurred during the SARS-CoV-2 public health emergency.  Safety protocols were in place, including screening questions prior to the visit, additional usage of staff PPE, and extensive cleaning of exam room while observing appropriate contact time as indicated for disinfecting solutions.  Subjective:     Patient ID: Virginia [name] , female    DOB: [birth year] , 58 y.o.   MRN: [id number]   Chief Complaint  Patient presents with  . Annual Exam    HPI  She is here today for a full physical exam. She is no longer followed by GYN. She is s/p hysterectomy.     Past Medical History:  Diagnosis Date  . Anxiety   . Arthritis   . Chest pain, unspecified   . Dermatitis   . HLD (hyperlipidemia)    no current med.  . Insomnia   . Loss of hair   . Low back pain   . Major depressive disorder, single episode, mild ([place])   . Personal history of colonic polyps   . Shoulder impingement 06/2012   left  . Vitamin D deficiency   . Wears dentures    upper denture  . Wears partial dentures    lower partial     Family History  Problem Relation Age of Onset  . Heart attack Other   . Hypertension Other   . Diabetes Other   . COPD Other   . COPD Mother   . Heart disease Mother   . Diabetes Mother   . Depression Mother   . Hypothyroidism Father      Current Outpatient Medications:  .  ARIPiprazole (ABILIFY) 20 MG tablet, Take 20 mg by mouth daily. , Disp: , Rfl:  .  desvenlafaxine (PRISTIQ) 100 MG 24 hr tablet, TAKE 1 TABLET BY MOUTH EVERY DAY. NEEDS APPOINTMENT, Disp: 30 tablet, Rfl: 2 .  LIVALO 2 MG TABS, TAKE 1 TABLET BY MOUTH EVERY DAY, Disp: 30 tablet, Rfl: 0 .  Vitamin D, Cholecalciferol, 50 MCG (2000 UT) CAPS, Take by mouth., Disp: , Rfl:  .  zolpidem (AMBIEN) 10 MG tablet, Take 10 mg by mouth at bedtime., Disp: , Rfl:  .  atorvastatin (LIPITOR) 40 MG tablet, One tab po MWF, Disp: 45 tablet, Rfl: 1 .  clindamycin (CLEOCIN) 300 MG capsule, 2 capsules one hour prior to  dental procedure (Patient not taking: Reported on 11/06/2019), Disp: 2 capsule, Rfl: 1   Allergies  Allergen Reactions  . Doxycycline Itching  . Penicillins Hives and Swelling    SWELLING OF TONGUE SWELLING OF JOINTS  . Rosuvastatin Other (See Comments)    MUSCLE ACHES     The patient states she uses status post hysterectomy for birth control. Last LMP was No LMP recorded. Patient has had a hysterectomy.. Negative for DysmenorrheaNegative for: breast discharge, breast lump(s), breast pain and breast self exam. Associated symptoms include abnormal vaginal bleeding. Pertinent negatives include abnormal bleeding (hematology), anxiety, decreased libido, depression, difficulty falling sleep, dyspareunia, history of infertility, nocturia, sexual dysfunction, sleep disturbances, urinary incontinence, urinary urgency, vaginal discharge and vaginal itching. Diet regular.The patient states her exercise level is  intermittent.   . The patient's tobacco use is:  Social History   Tobacco Use  Smoking Status Former Smoker  . Packs/day: 2.00  . Years: 40.00  . Pack years: 80.00  Smokeless Tobacco Never Used  Tobacco Comment   quit smoking 02/2012  . She has been exposed to passive smoke. The patient's alcohol use is:  Social History  Substance and Sexual Activity  Alcohol Use Yes   Comment: seldom    Review of Systems  Constitutional: Negative.   HENT: Negative.   Eyes: Negative.   Respiratory: Negative.   Cardiovascular: Negative.   Endocrine: Negative.   Genitourinary: Negative.   Musculoskeletal: Negative.   Skin: Negative.   Allergic/Immunologic: Negative.   Neurological: Negative.   Hematological: Negative.   Psychiatric/Behavioral: Negative.      Today's Vitals   11/06/19 1530  BP: 114/70  Pulse: 98  Temp: 97.7 F (36.5 C)  TempSrc: Oral  Weight: 195 lb 9.6 oz (88.7 kg)  Height: 5\' 3"  (1.6 m)  PainSc: 4   PainLoc: Back   Body mass index is 34.65 kg/m.    Objective:  Physical Exam Vitals and nursing note reviewed.  Constitutional:      Appearance: Normal appearance. She is obese.  HENT:     Head: Normocephalic and atraumatic.     Right Ear: Tympanic membrane, ear canal and external ear normal.     Left Ear: Tympanic membrane, ear canal and external ear normal.     Nose: Nose normal.     Mouth/Throat:     Mouth: Mucous membranes are moist.     Pharynx: Oropharynx is clear.  Eyes:     Extraocular Movements: Extraocular movements intact.     Conjunctiva/sclera: Conjunctivae normal.     Pupils: Pupils are equal, round, and reactive to light.  Cardiovascular:     Rate and Rhythm: Normal rate and regular rhythm.     Pulses: Normal pulses.     Heart sounds: Normal heart sounds.  Pulmonary:     Effort: Pulmonary effort is normal.     Breath sounds: Normal breath sounds.  Chest:     Breasts: Tanner Score is 5.        Right: Normal.        Left: Normal.  Abdominal:     General: Bowel sounds are normal.     Palpations: Abdomen is soft.     Comments: Rounded, soft  Genitourinary:    Comments: deferred Musculoskeletal:        General: Normal range of motion.     Cervical back: Normal range of motion and neck supple.  Skin:    General: Skin is warm and dry.  Neurological:     General: No focal deficit present.     Mental Status: She is alert and oriented to person, place, and time.  Psychiatric:        Mood and Affect: Mood normal.        Behavior: Behavior normal.         Assessment And Plan:     1. Routine general medical examination at health care facility  A full exam was performed.  Importance of monthly self breast exams was discussed with the patient. PATIENT HAS BEEN ADVISED TO GET 30-45 MINUTES REGULAR EXERCISE NO LESS THAN FOUR TO FIVE DAYS PER WEEK - BOTH WEIGHTBEARING EXERCISES AND AEROBIC ARE RECOMMENDED.  SHE iS ADVISED TO FOLLOW A HEALTHY DIET WITH AT LEAST SIX FRUITS/VEGGIES PER DAY, DECREASE INTAKE OF RED  MEAT, AND TO INCREASE FISH INTAKE TO TWO DAYS PER WEEK.  MEATS/FISH SHOULD NOT BE FRIED, BAKED OR BROILED IS PREFERABLE.  I SUGGEST WEARING SPF 50 SUNSCREEN ON EXPOSED PARTS AND ESPECIALLY WHEN IN THE DIRECT SUNLIGHT FOR AN EXTENDED PERIOD OF TIME.  PLEASE AVOID FAST FOOD RESTAURANTS AND INCREASE YOUR WATER INTAKE.  - POCT Urinalysis Dipstick (81002)  2. Pure hypercholesterolemia  Chronic. She has been unable to tolerate statins in the past. She initially did okay with Livalo, but the cost was too much for her. Her most recent LDL was 170 Nov 2020. She agrees to start atorvastatin with three days per weekly dosing. She will rto in six weeks for re-evaluation. I will check repeat chol panel at that time.   3. Major depressive disorder with single episode, in partial remission (HCC)  Chronic. She has noticed improved symptoms with addition of Abilify to her current regimen as per Psychiatry. She will continue with current meds.   4. Coronary artery disease involving native coronary artery of native heart without angina pectoris  Low dose CT scan results reviewed in full detail. She agrees to Cardiology referral for further evaluation of CAD. At this time, she denies sx of chest pain and SOB. The importance of statin compliance was again discussed with the patient.   - Ambulatory referral to Cardiology  5. Breast cancer screening by mammogram  She agrees to referral for Mammogram.   - MM Digital Screening; Future  6. Class 1 obesity due to excess calories with serious comorbidity and body mass index (BMI) of 34.0 to 34.9 in adult  She is encouraged to strive for BMI less than 30 to decrease cardiac risk. She is encouraged to incorporate more exercise into her daily routine.   7. History of tobacco use disorder  Feb 2020 low dose CT results reviewed in full detail. Pt advised she needs to have this done yearly. She is not smoking any cigarettes at this time.   - CT CHEST LUNG CA SCREEN  LOW DOSE W/O CM; Future  8. Need for vaccination  - Flu Vaccine QUAD 6+ mos PF IM (Fluarix Quad PF)        Maximino Greenland, MD    THE PATIENT IS ENCOURAGED TO PRACTICE SOCIAL DISTANCING DUE TO THE COVID-19 PANDEMIC.

## 2019-12-05 DIAGNOSIS — E785 Hyperlipidemia, unspecified: Secondary | ICD-10-CM | POA: Diagnosis not present

## 2019-12-05 DIAGNOSIS — I2510 Atherosclerotic heart disease of native coronary artery without angina pectoris: Secondary | ICD-10-CM | POA: Diagnosis not present

## 2019-12-05 DIAGNOSIS — Z8249 Family history of ischemic heart disease and other diseases of the circulatory system: Secondary | ICD-10-CM | POA: Diagnosis not present

## 2019-12-05 DIAGNOSIS — Z87891 Personal history of nicotine dependence: Secondary | ICD-10-CM | POA: Diagnosis not present

## 2019-12-18 ENCOUNTER — Ambulatory Visit: Payer: Medicare Other | Admitting: Internal Medicine

## 2019-12-19 ENCOUNTER — Ambulatory Visit: Payer: Medicare Other

## 2019-12-21 ENCOUNTER — Other Ambulatory Visit: Payer: Self-pay

## 2019-12-21 ENCOUNTER — Ambulatory Visit
Admission: RE | Admit: 2019-12-21 | Discharge: 2019-12-21 | Disposition: A | Discharge status: Home / Self Care | Payer: Medicare Other | Source: Ambulatory Visit | Attending: Internal Medicine | Admitting: Internal Medicine

## 2019-12-21 DIAGNOSIS — Z1231 Encounter for screening mammogram for malignant neoplasm of breast: Secondary | ICD-10-CM | POA: Diagnosis not present

## 2020-01-02 DIAGNOSIS — I071 Rheumatic tricuspid insufficiency: Secondary | ICD-10-CM | POA: Diagnosis not present

## 2020-01-02 DIAGNOSIS — I4510 Unspecified right bundle-branch block: Secondary | ICD-10-CM | POA: Diagnosis not present

## 2020-01-02 DIAGNOSIS — M79606 Pain in leg, unspecified: Secondary | ICD-10-CM | POA: Diagnosis not present

## 2020-01-02 DIAGNOSIS — Z87891 Personal history of nicotine dependence: Secondary | ICD-10-CM | POA: Diagnosis not present

## 2020-01-02 DIAGNOSIS — I119 Hypertensive heart disease without heart failure: Secondary | ICD-10-CM | POA: Diagnosis not present

## 2020-01-02 DIAGNOSIS — E785 Hyperlipidemia, unspecified: Secondary | ICD-10-CM | POA: Diagnosis not present

## 2020-01-02 DIAGNOSIS — Z8249 Family history of ischemic heart disease and other diseases of the circulatory system: Secondary | ICD-10-CM | POA: Diagnosis not present

## 2020-01-02 DIAGNOSIS — R0789 Other chest pain: Secondary | ICD-10-CM | POA: Diagnosis not present

## 2020-01-02 DIAGNOSIS — I2510 Atherosclerotic heart disease of native coronary artery without angina pectoris: Secondary | ICD-10-CM | POA: Diagnosis not present

## 2020-01-11 ENCOUNTER — Ambulatory Visit
Admission: RE | Admit: 2020-01-11 | Discharge: 2020-01-11 | Disposition: A | Discharge status: Home / Self Care | Payer: Medicare Other | Source: Ambulatory Visit | Attending: Internal Medicine | Admitting: Internal Medicine

## 2020-01-11 DIAGNOSIS — Z87891 Personal history of nicotine dependence: Secondary | ICD-10-CM

## 2020-04-16 ENCOUNTER — Ambulatory Visit: Payer: Medicare Other

## 2020-04-24 ENCOUNTER — Ambulatory Visit (INDEPENDENT_AMBULATORY_CARE_PROVIDER_SITE_OTHER): Payer: Medicare Other | Admitting: Internal Medicine

## 2020-04-24 ENCOUNTER — Ambulatory Visit (INDEPENDENT_AMBULATORY_CARE_PROVIDER_SITE_OTHER): Payer: Medicare Other

## 2020-04-24 ENCOUNTER — Encounter: Payer: Self-pay | Admitting: Internal Medicine

## 2020-04-24 ENCOUNTER — Other Ambulatory Visit: Payer: Self-pay

## 2020-04-24 VITALS — BP 118/70 | HR 74 | Temp 98.1°F | Ht 63.0 in | Wt 193.2 lb

## 2020-04-24 VITALS — BP 118/70 | HR 74 | Temp 98.1°F | Ht 63.0 in | Wt 193.0 lb

## 2020-04-24 DIAGNOSIS — Z79899 Other long term (current) drug therapy: Secondary | ICD-10-CM | POA: Diagnosis not present

## 2020-04-24 DIAGNOSIS — Z0000 Encounter for general adult medical examination without abnormal findings: Secondary | ICD-10-CM | POA: Diagnosis not present

## 2020-04-24 DIAGNOSIS — E7800 Pure hypercholesterolemia, unspecified: Secondary | ICD-10-CM | POA: Diagnosis not present

## 2020-04-24 DIAGNOSIS — F324 Major depressive disorder, single episode, in partial remission: Secondary | ICD-10-CM | POA: Diagnosis not present

## 2020-04-24 DIAGNOSIS — E6609 Other obesity due to excess calories: Secondary | ICD-10-CM

## 2020-04-24 DIAGNOSIS — Z6834 Body mass index (BMI) 34.0-34.9, adult: Secondary | ICD-10-CM

## 2020-04-24 NOTE — Patient Instructions (Signed)
Virginia [name] , Thank you for taking time to come for your Medicare Wellness Visit. I appreciate your ongoing commitment to your health goals. Please review the following plan we discussed and let me know if I can assist you in the future.   Screening recommendations/referrals: Colonoscopy: 08/2013 Mammogram: 12/2019 Bone Density: n/a Recommended yearly ophthalmology/optometry visit for glaucoma screening and checkup Recommended yearly dental visit for hygiene and checkup  Vaccinations: Influenza vaccine: 10/2019 Pneumococcal vaccine: n/a Tdap vaccine: 07/2010 Shingles vaccine: discussed    Advanced directives: Please bring a copy of your POA (Power of [place]) and/or Living Will to your next appointment.   Conditions/risks identified: obesity  Next appointment: 11/06/2020 at 2:00  Preventive Care 40-64 Years, Female Preventive care refers to lifestyle choices and visits with your health care provider that can promote health and wellness. What does preventive care include?  A yearly physical exam. This is also called an annual well check.  Dental exams once or twice a year.  Routine eye exams. Ask your health care provider how often you should have your eyes checked.  Personal lifestyle choices, including:  Daily care of your teeth and gums.  Regular physical activity.  Eating a healthy diet.  Avoiding tobacco and drug use.  Limiting alcohol use.  Practicing safe sex.  Taking low-dose aspirin daily starting at age 55.  Taking vitamin and mineral supplements as recommended by your health care provider. What happens during an annual well check? The services and screenings done by your health care provider during your annual well check will depend on your age, overall health, lifestyle risk factors, and family history of disease. Counseling  Your health care provider may ask you questions about your:  Alcohol use.  Tobacco use.  Drug use.  Emotional  well-being.  Home and relationship well-being.  Sexual activity.  Eating habits.  Work and work Statistician.  Method of birth control.  Menstrual cycle.  Pregnancy history. Screening  You may have the following tests or measurements:  Height, weight, and BMI.  Blood pressure.  Lipid and cholesterol levels. These may be checked every 5 years, or more frequently if you are over 93 years old.  Skin check.  Lung cancer screening. You may have this screening every year starting at age 72 if you have a 30-pack-year history of smoking and currently smoke or have quit within the past 15 years.  Fecal occult blood test (FOBT) of the stool. You may have this test every year starting at age 57.  Flexible sigmoidoscopy or colonoscopy. You may have a sigmoidoscopy every 5 years or a colonoscopy every 10 years starting at age 61.  Hepatitis C blood test.  Hepatitis B blood test.  Sexually transmitted disease (STD) testing.  Diabetes screening. This is done by checking your blood sugar (glucose) after you have not eaten for a while (fasting). You may have this done every 1-3 years.  Mammogram. This may be done every 1-2 years. Talk to your health care provider about when you should start having regular mammograms. This may depend on whether you have a family history of breast cancer.  BRCA-related cancer screening. This may be done if you have a family history of breast, ovarian, tubal, or peritoneal cancers.  Pelvic exam and Pap test. This may be done every 3 years starting at age 66. Starting at age 81, this may be done every 5 years if you have a Pap test in combination with an HPV test.  Bone density scan. This  is done to screen for osteoporosis. You may have this scan if you are at high risk for osteoporosis. Discuss your test results, treatment options, and if necessary, the need for more tests with your health care provider. Vaccines  Your health care provider may recommend  certain vaccines, such as:  Influenza vaccine. This is recommended every year.  Tetanus, diphtheria, and acellular pertussis (Tdap, Td) vaccine. You may need a Td booster every 10 years.  Zoster vaccine. You may need this after age 9.  Pneumococcal 13-valent conjugate (PCV13) vaccine. You may need this if you have certain conditions and were not previously vaccinated.  Pneumococcal polysaccharide (PPSV23) vaccine. You may need one or two doses if you smoke cigarettes or if you have certain conditions. Talk to your health care provider about which screenings and vaccines you need and how often you need them. This information is not intended to replace advice given to you by your health care provider. Make sure you discuss any questions you have with your health care provider. Document Released: 11/29/2015 Document Revised: 07/22/2016 Document Reviewed: 09/03/2015 Elsevier Interactive Patient Education  2017 [place] Prevention in the Home Falls can cause injuries. They can happen to people of all ages. There are many things you can do to make your home safe and to help prevent falls. What can I do on the outside of my home?  Regularly fix the edges of walkways and driveways and fix any cracks.  Remove anything that might make you trip as you walk through a door, such as a raised step or threshold.  Trim any bushes or trees on the path to your home.  Use bright outdoor lighting.  Clear any walking paths of anything that might make someone trip, such as rocks or tools.  Regularly check to see if handrails are loose or broken. Make sure that both sides of any steps have handrails.  Any raised decks and porches should have guardrails on the edges.  Have any leaves, snow, or ice cleared regularly.  Use sand or salt on walking paths during winter.  Clean up any spills in your garage right away. This includes oil or grease spills. What can I do in the bathroom?  Use  night lights.  Install grab bars by the toilet and in the tub and shower. Do not use towel bars as grab bars.  Use non-skid mats or decals in the tub or shower.  If you need to sit down in the shower, use a plastic, non-slip stool.  Keep the floor dry. Clean up any water that spills on the floor as soon as it happens.  Remove soap buildup in the tub or shower regularly.  Attach bath mats securely with double-sided non-slip rug tape.  Do not have throw rugs and other things on the floor that can make you trip. What can I do in the bedroom?  Use night lights.  Make sure that you have a light by your bed that is easy to reach.  Do not use any sheets or blankets that are too big for your bed. They should not hang down onto the floor.  Have a firm chair that has side arms. You can use this for support while you get dressed.  Do not have throw rugs and other things on the floor that can make you trip. What can I do in the kitchen?  Clean up any spills right away.  Avoid walking on wet floors.  Keep  items that you use a lot in easy-to-reach places.  If you need to reach something above you, use a strong step stool that has a grab bar.  Keep electrical cords out of the way.  Do not use floor polish or wax that makes floors slippery. If you must use wax, use non-skid floor wax.  Do not have throw rugs and other things on the floor that can make you trip. What can I do with my stairs?  Do not leave any items on the stairs.  Make sure that there are handrails on both sides of the stairs and use them. Fix handrails that are broken or loose. Make sure that handrails are as long as the stairways.  Check any carpeting to make sure that it is firmly attached to the stairs. Fix any carpet that is loose or worn.  Avoid having throw rugs at the top or bottom of the stairs. If you do have throw rugs, attach them to the floor with carpet tape.  Make sure that you have a light switch at  the top of the stairs and the bottom of the stairs. If you do not have them, ask someone to add them for you. What else can I do to help prevent falls?  Wear shoes that:  Do not have high heels.  Have rubber bottoms.  Are comfortable and fit you well.  Are closed at the toe. Do not wear sandals.  If you use a stepladder:  Make sure that it is fully opened. Do not climb a closed stepladder.  Make sure that both sides of the stepladder are locked into place.  Ask someone to hold it for you, if possible.  Clearly mark and make sure that you can see:  Any grab bars or handrails.  First and last steps.  Where the edge of each step is.  Use tools that help you move around (mobility aids) if they are needed. These include:  Canes.  Walkers.  Scooters.  Crutches.  Turn on the lights when you go into a dark area. Replace any light bulbs as soon as they burn out.  Set up your furniture so you have a clear path. Avoid moving your furniture around.  If any of your floors are uneven, fix them.  If there are any pets around you, be aware of where they are.  Review your medicines with your doctor. Some medicines can make you feel dizzy. This can increase your chance of falling. Ask your doctor what other things that you can do to help prevent falls. This information is not intended to replace advice given to you by your health care provider. Make sure you discuss any questions you have with your health care provider. Document Released: 08/29/2009 Document Revised: 04/09/2016 Document Reviewed: 12/07/2014 Elsevier Interactive Patient Education  2017 [employer].

## 2020-04-24 NOTE — Progress Notes (Signed)
This visit occurred during the SARS-CoV-2 public health emergency.  Safety protocols were in place, including screening questions prior to the visit, additional usage of staff PPE, and extensive cleaning of exam room while observing appropriate contact time as indicated for disinfecting solutions.  Subjective:   Virginia [name] is a 59 y.o. female who presents for Medicare Annual (Subsequent) preventive examination.  Review of Systems:  n/a Cardiac Risk Factors include: obesity (BMI >30kg/m2);sedentary lifestyle     Objective:     Vitals: BP 118/70 (BP Location: Left Arm, Patient Position: Sitting, Cuff Size: Normal)   Pulse 74   Temp 98.1 F (36.7 C) (Oral)   Ht 5\' 3"  (1.6 m)   Wt 193 lb 3.2 oz (87.6 kg)   SpO2 96%   BMI 34.22 kg/m   Body mass index is 34.22 kg/m.  Advanced Directives 04/24/2020 04/12/2019 07/14/2012  Does Patient Have a Medical Advance Directive? Yes Yes Patient does not have advance directive;Patient would not like information  Type of Scientist, forensic Power of [place];Living will [place];Living will -  Copy of [place] in Chart? No - copy requested No - copy requested -    Tobacco Social History   Tobacco Use  Smoking Status Former Smoker  . Packs/day: 2.00  . Years: 40.00  . Pack years: 80.00  Smokeless Tobacco Never Used  Tobacco Comment   quit smoking 02/2012     Counseling given: Not Answered Comment: quit smoking 02/2012   Clinical Intake:  Pre-visit preparation completed: Yes  Pain : No/denies pain     Nutritional Status: BMI > 30  Obese Nutritional Risks: None Diabetes: No  How often do you need to have someone help you when you read instructions, pamphlets, or other written materials from your doctor or pharmacy?: 1 - Never What is the last grade level you completed in school?: 12th grade  Interpreter Needed?: No  Information entered by :: NAllen LPN  Past Medical History:    Diagnosis Date  . Anxiety   . Arthritis   . Chest pain, unspecified   . Dermatitis   . HLD (hyperlipidemia)    no current med.  . Insomnia   . Loss of hair   . Low back pain   . Major depressive disorder, single episode, mild ([place])   . Personal history of colonic polyps   . Shoulder impingement 06/2012   left  . Vitamin D deficiency   . Wears dentures    upper denture  . Wears partial dentures    lower partial   Past Surgical History:  Procedure Laterality Date  . ABDOMINAL HYSTERECTOMY  1990s   partial  . APPENDECTOMY  06/08/2006  . CARPAL TUNNEL RELEASE  01/14/2005   left; with left cubital tunnel release  . EXPLORATORY LAPAROTOMY WITH ABDOMINAL MASS EXCISION  06/08/2006   resection right ovarian mass, partial omentectomy  . FULKERSON SLIDE  10/19/2007   right knee  . KNEE ARTHROSCOPY  05/28/2010   left  . KNEE ARTHROSCOPY     right  . SALPINGOOPHORECTOMY  06/08/2006   bilat.  Marland Kitchen TOTAL KNEE ARTHROPLASTY  05/04/2011   left   Family History  Problem Relation Age of Onset  . Heart attack Other   . Hypertension Other   . Diabetes Other   . COPD Other   . COPD Mother   . Heart disease Mother   . Diabetes Mother   . Depression Mother   . Hypothyroidism Father  Social History   Socioeconomic History  . Marital status: Single    Spouse name: Not on file  . Number of children: Not on file  . Years of education: Not on file  . Highest education level: Not on file  Occupational History  . Occupation: disability  Tobacco Use  . Smoking status: Former Smoker    Packs/day: 2.00    Years: 40.00    Pack years: 80.00  . Smokeless tobacco: Never Used  . Tobacco comment: quit smoking 02/2012  Substance and Sexual Activity  . Alcohol use: Yes    Comment: moderate, socially  . Drug use: No  . Sexual activity: Not Currently  Other Topics Concern  . Not on file  Social History Narrative  . Not on file   Social Determinants of Health   Financial Resource Strain:  Low Risk   . Difficulty of Paying Living Expenses: Not hard at all  Food Insecurity: No Food Insecurity  . Worried About Charity fundraiser in the Last Year: Never true  . Ran Out of Food in the Last Year: Never true  Transportation Needs: No Transportation Needs  . Lack of Transportation (Medical): No  . Lack of Transportation (Non-Medical): No  Physical Activity: Insufficiently Active  . Days of Exercise per Week: 3 days  . Minutes of Exercise per Session: 10 min  Stress: No Stress Concern Present  . Feeling of Stress : Not at all  Social Connections:   . Frequency of Communication with Friends and Family:   . Frequency of Social Gatherings with Friends and Family:   . Attends Religious Services:   . Active Member of Clubs or Organizations:   . Attends Archivist Meetings:   Marland Kitchen Marital Status:     Outpatient Encounter Medications as of 04/24/2020  Medication Sig  . ARIPiprazole (ABILIFY) 20 MG tablet Take 20 mg by mouth daily.   Marland Kitchen atorvastatin (LIPITOR) 40 MG tablet One tab po MWF  . desvenlafaxine (PRISTIQ) 100 MG 24 hr tablet TAKE 1 TABLET BY MOUTH EVERY DAY. NEEDS APPOINTMENT  . Vitamin D, Cholecalciferol, 50 MCG (2000 UT) CAPS Take by mouth.  . zolpidem (AMBIEN) 10 MG tablet Take 10 mg by mouth at bedtime.  . clindamycin (CLEOCIN) 300 MG capsule 2 capsules one hour prior to dental procedure (Patient not taking: Reported on 11/06/2019)  . LIVALO 2 MG TABS TAKE 1 TABLET BY MOUTH EVERY DAY (Patient not taking: Reported on 04/24/2020)   No facility-administered encounter medications on file as of 04/24/2020.    Activities of Daily Living In your present state of health, do you have any difficulty performing the following activities: 04/24/2020  Hearing? N  Vision? N  Difficulty concentrating or making decisions? N  Walking or climbing stairs? N  Dressing or bathing? N  Doing errands, shopping? N  Preparing Food and eating ? N  Using the Toilet? N  In the past six  months, have you accidently leaked urine? Y  Do you have problems with loss of bowel control? N  Managing your Medications? N  Managing your Finances? N  Housekeeping or managing your Housekeeping? N  Some recent data might be hidden    Patient Care Team: Glendale Chard, MD as PCP - General (Internal Medicine)    Assessment:   This is a routine wellness examination for Jayah.  Exercise Activities and Dietary recommendations Current Exercise Habits: Home exercise routine, Type of exercise: walking, Time (Minutes): 10, Frequency (Times/Week): 3, Weekly Exercise (  Minutes/Week): 30  Goals    . Patient Stated     Decrease cholesterol    . Patient Stated     04/24/2020, wants to weigh 140 pounds    . Weight (lb) < 200 lb (90.7 kg)     Wants to weigh 136 pounds       Fall Risk Fall Risk  04/24/2020 04/12/2019 09/01/2018  Falls in the past year? 0 0 No  Risk for fall due to : Medication side effect - -  Follow up Falls evaluation completed;Education provided;Falls prevention discussed Falls prevention discussed -   Is the patient's home free of loose throw rugs in walkways, pet beds, electrical cords, etc?   yes      Grab bars in the bathroom? yes      Handrails on the stairs?   yes      Adequate lighting?   yes  Timed Get Up and Go performed: n/a  Depression Screen PHQ 2/9 Scores 04/24/2020 11/06/2019 04/12/2019 12/05/2018  PHQ - 2 Score 2 0 0 5  PHQ- 9 Score 2 - 1 17     Cognitive Function     6CIT Screen 04/24/2020 04/12/2019  What Year? 0 points 0 points  What month? 0 points 0 points  What time? 0 points 0 points  Count back from 20 0 points 0 points  Months in reverse 0 points 0 points  Repeat phrase 4 points 0 points  Total Score 4 0    Immunization History  Administered Date(s) Administered  . DTaP 08/12/2010  . Influenza Inj Mdck Quad With Preservative 10/25/2017  . Influenza,inj,Quad PF,6+ Mos 11/06/2019  . PFIZER SARS-COV-2 Vaccination 02/06/2020, 02/27/2020     Qualifies for Shingles Vaccine? no  Screening Tests Health Maintenance  Topic Date Due  . PAP SMEAR-Modifier  07/23/2014  . INFLUENZA VACCINE  06/16/2020  . TETANUS/TDAP  08/12/2020  . MAMMOGRAM  12/20/2021  . COLONOSCOPY  09/02/2023  . COVID-19 Vaccine  Completed  . Hepatitis C Screening  Completed  . HIV Screening  Completed    Cancer Screenings: Lung: Low Dose CT Chest recommended if Age 77-80 years, 30 pack-year currently smoking OR have quit w/in 15years. Patient does not qualify. Breast:  Up to date on Mammogram? Yes   Up to date of Bone Density/Dexa? n/a Colorectal: up to date  Additional Screenings: : Hepatitis C Screening: 12/05/2018     Plan:    Patient wants to weigh 140 pounds.   I have personally reviewed and noted the following in the patient's chart:   . Medical and social history . Use of alcohol, tobacco or illicit drugs  . Current medications and supplements . Functional ability and status . Nutritional status . Physical activity . Advanced directives . List of other physicians . Hospitalizations, surgeries, and ER visits in previous 12 months . Vitals . Screenings to include cognitive, depression, and falls . Referrals and appointments  In addition, I have reviewed and discussed with patient certain preventive protocols, quality metrics, and best practice recommendations. A written personalized care plan for preventive services as well as general preventive health recommendations were provided to patient.     Kellie Simmering, LPN  07/21/7095

## 2020-04-24 NOTE — Patient Instructions (Signed)

## 2020-04-25 LAB — LIPID PANEL
Chol/HDL Ratio: 4.1 ratio (ref 0.0–4.4)
Cholesterol, Total: 244 mg/dL — ABNORMAL HIGH (ref 100–199)
HDL: 59 mg/dL (ref >39)
LDL Chol Calc (NIH): 151 mg/dL — ABNORMAL HIGH (ref 0–99)
Triglycerides: 188 mg/dL — ABNORMAL HIGH (ref 0–149)
VLDL Cholesterol Cal: 34 mg/dL (ref 5–40)

## 2020-04-25 LAB — CMP14+EGFR
ALT: 25 IU/L (ref 0–32)
AST: 22 IU/L (ref 0–40)
Albumin/Globulin Ratio: 2 (ref 1.2–2.2)
Albumin: 4.8 g/dL (ref 3.8–4.9)
Alkaline Phosphatase: 99 IU/L (ref 48–121)
BUN/Creatinine Ratio: 20 (ref 9–23)
BUN: 19 mg/dL (ref 6–24)
Bilirubin Total: 0.3 mg/dL (ref 0.0–1.2)
CO2: 25 mmol/L (ref 20–29)
Calcium: 9.6 mg/dL (ref 8.7–10.2)
Chloride: 100 mmol/L (ref 96–106)
Creatinine, Ser: 0.97 mg/dL (ref 0.57–1.00)
GFR calc Af Amer: 74 mL/min/{1.73_m2} (ref >59)
GFR calc non Af Amer: 65 mL/min/{1.73_m2} (ref >59)
Globulin, Total: 2.4 g/dL (ref 1.5–4.5)
Glucose: 81 mg/dL (ref 65–99)
Potassium: 4.8 mmol/L (ref 3.5–5.2)
Sodium: 143 mmol/L (ref 134–144)
Total Protein: 7.2 g/dL (ref 6.0–8.5)

## 2020-04-25 LAB — INSULIN, RANDOM: INSULIN: 9.7 u[IU]/mL (ref 2.6–24.9)

## 2020-04-28 NOTE — Progress Notes (Signed)
This visit occurred during the SARS-CoV-2 public health emergency.  Safety protocols were in place, including screening questions prior to the visit, additional usage of staff PPE, and extensive cleaning of exam room while observing appropriate contact time as indicated for disinfecting solutions.  Subjective:     Patient ID: Virginia [name] , female    DOB: [birth year] , 59 y.o.   MRN: [id number]   Chief Complaint  Patient presents with  . Hyperlipidemia    HPI  She is here today for cholesterol check. She has been taking atorvastatin three days per week.  She has had no issues with the medication.   Hyperlipidemia This is a chronic problem. The problem is uncontrolled. Exacerbating diseases include obesity. Pertinent negatives include no chest pain, myalgias or shortness of breath. Current antihyperlipidemic treatment includes statins. Compliance problems include adherence to exercise.  Risk factors for coronary artery disease include post-menopausal, a sedentary lifestyle and obesity.     Past Medical History:  Diagnosis Date  . Anxiety   . Arthritis   . Chest pain, unspecified   . Dermatitis   . HLD (hyperlipidemia)    no current med.  . Insomnia   . Loss of hair   . Low back pain   . Major depressive disorder, single episode, mild ([place])   . Personal history of colonic polyps   . Shoulder impingement 06/2012   left  . Vitamin D deficiency   . Wears dentures    upper denture  . Wears partial dentures    lower partial     Family History  Problem Relation Age of Onset  . Heart attack Other   . Hypertension Other   . Diabetes Other   . COPD Other   . COPD Mother   . Heart disease Mother   . Diabetes Mother   . Depression Mother   . Hypothyroidism Father      Current Outpatient Medications:  .  ARIPiprazole (ABILIFY) 20 MG tablet, Take 20 mg by mouth daily. , Disp: , Rfl:  .  atorvastatin (LIPITOR) 40 MG tablet, One tab po MWF, Disp: 45 tablet, Rfl: 1 .   clindamycin (CLEOCIN) 300 MG capsule, 2 capsules one hour prior to dental procedure (Patient not taking: Reported on 11/06/2019), Disp: 2 capsule, Rfl: 1 .  desvenlafaxine (PRISTIQ) 100 MG 24 hr tablet, TAKE 1 TABLET BY MOUTH EVERY DAY. NEEDS APPOINTMENT, Disp: 30 tablet, Rfl: 2 .  Vitamin D, Cholecalciferol, 50 MCG (2000 UT) CAPS, Take by mouth., Disp: , Rfl:  .  zolpidem (AMBIEN) 10 MG tablet, Take 10 mg by mouth at bedtime., Disp: , Rfl:    Allergies  Allergen Reactions  . Doxycycline Itching  . Penicillins Hives and Swelling    SWELLING OF TONGUE SWELLING OF JOINTS  . Rosuvastatin Other (See Comments)    MUSCLE ACHES     Review of Systems  Constitutional: Negative.   Respiratory: Negative.  Negative for shortness of breath.   Cardiovascular: Negative.  Negative for chest pain.  Gastrointestinal: Negative.   Musculoskeletal: Negative for myalgias.  Neurological: Negative.   Psychiatric/Behavioral: Negative.      Today's Vitals   04/24/20 1416  BP: 118/70  Pulse: 74  Temp: 98.1 F (36.7 C)  TempSrc: Oral  SpO2: 98%  Weight: 193 lb (87.5 kg)  Height: _0  (1.6 m)   Body mass index is 34.19 kg/m.   Objective:  Physical Exam Vitals and nursing note reviewed.  Constitutional:  Appearance: Normal appearance. She is obese.  HENT:     Head: Normocephalic and atraumatic.  Cardiovascular:     Rate and Rhythm: Normal rate and regular rhythm.     Heart sounds: Normal heart sounds.  Pulmonary:     Effort: Pulmonary effort is normal.     Breath sounds: Normal breath sounds.  Skin:    General: Skin is warm.  Neurological:     General: No focal deficit present.     Mental Status: She is alert.  Psychiatric:        Mood and Affect: Mood normal.        Behavior: Behavior normal.         Assessment And Plan:     1. Pure hypercholesterolemia  Chronic, I will check labs as listed below. Pt advised that I may need to increase dosing days of atorvastatin. I will  make further recommendations once her labs are available for review. She is encouraged to take meds as prescribed, avoid fried foods and increase daily activity.   - Lipid panel - Insulin, random(561)  2. Major depressive disorder with single episode, in partial remission (HCC)  Chronic, yet stable. She will continue with Pristiq daily. Advised to take meds as prescribed.   3. Class 1 obesity due to excess calories with serious comorbidity and body mass index (BMI) of 34.0 to 34.9 in adult  She is encouraged to strive for BMI less than 30 to decrease cardiac risk. Advised to exercise at least 30 minutes five days per week.   4. Drug therapy  - CMP14+EGFR     Maximino Greenland, MD    THE PATIENT IS ENCOURAGED TO PRACTICE SOCIAL DISTANCING DUE TO THE COVID-19 PANDEMIC.

## 2020-05-05 ENCOUNTER — Encounter: Payer: Self-pay | Admitting: Internal Medicine

## 2020-05-06 ENCOUNTER — Other Ambulatory Visit: Payer: Self-pay

## 2020-05-06 MED ORDER — ATORVASTATIN CALCIUM 40 MG PO TABS
ORAL_TABLET | ORAL | 1 refills | Status: DC
Start: 2020-05-06 — End: 2020-05-22

## 2020-05-22 ENCOUNTER — Other Ambulatory Visit: Payer: Self-pay

## 2020-05-22 MED ORDER — ATORVASTATIN CALCIUM 40 MG PO TABS: ORAL_TABLET | ORAL | 1 refills | Status: DC

## 2020-07-22 ENCOUNTER — Encounter: Payer: Self-pay | Admitting: Internal Medicine

## 2020-09-16 ENCOUNTER — Encounter: Payer: Self-pay | Admitting: Internal Medicine

## 2020-11-06 ENCOUNTER — Other Ambulatory Visit: Payer: Self-pay

## 2020-11-06 ENCOUNTER — Ambulatory Visit (INDEPENDENT_AMBULATORY_CARE_PROVIDER_SITE_OTHER): Payer: Medicare Other | Admitting: Nurse Practitioner

## 2020-11-06 ENCOUNTER — Encounter: Payer: Self-pay | Admitting: Nurse Practitioner

## 2020-11-06 VITALS — BP 122/74 | HR 78 | Temp 97.9°F | Ht 63.0 in | Wt 199.0 lb

## 2020-11-06 DIAGNOSIS — Z8709 Personal history of other diseases of the respiratory system: Secondary | ICD-10-CM

## 2020-11-06 DIAGNOSIS — E559 Vitamin D deficiency, unspecified: Secondary | ICD-10-CM | POA: Diagnosis not present

## 2020-11-06 DIAGNOSIS — E7800 Pure hypercholesterolemia, unspecified: Secondary | ICD-10-CM

## 2020-11-06 DIAGNOSIS — Z0000 Encounter for general adult medical examination without abnormal findings: Secondary | ICD-10-CM | POA: Diagnosis not present

## 2020-11-06 DIAGNOSIS — R7309 Other abnormal glucose: Secondary | ICD-10-CM

## 2020-11-06 DIAGNOSIS — Z23 Encounter for immunization: Secondary | ICD-10-CM

## 2020-11-06 MED ORDER — TETANUS-DIPHTH-ACELL PERTUSSIS 5-2.5-18.5 LF-MCG/0.5 IM SUSP: 0.5000 mL | Freq: Once | INTRAMUSCULAR | 0 refills | Status: AC

## 2020-11-06 MED ORDER — ALBUTEROL SULFATE HFA 108 (90 BASE) MCG/ACT IN AERS: 2.0000 | INHALATION_SPRAY | Freq: Four times a day (QID) | RESPIRATORY_TRACT | 2 refills | Status: DC | PRN

## 2020-11-06 NOTE — Patient Instructions (Addendum)
Health Maintenance, Female Adopting a healthy lifestyle and getting preventive care are important in promoting health and wellness. Ask your health care provider about:  The right schedule for you to have regular tests and exams.  Things you can do on your own to prevent diseases and keep yourself healthy. What should I know about diet, weight, and exercise? Eat a healthy diet   Eat a diet that includes plenty of vegetables, fruits, low-fat dairy products, and lean protein.  Do not eat a lot of foods that are high in solid fats, added sugars, or sodium. Maintain a healthy weight Body mass index (BMI) is used to identify weight problems. It estimates body fat based on height and weight. Your health care provider can help determine your BMI and help you achieve or maintain a healthy weight. Get regular exercise Get regular exercise. This is one of the most important things you can do for your health. Most adults should:  Exercise for at least 150 minutes each week. The exercise should increase your heart rate and make you sweat (moderate-intensity exercise).  Do strengthening exercises at least twice a week. This is in addition to the moderate-intensity exercise.  Spend less time sitting. Even light physical activity can be beneficial. Watch cholesterol and blood lipids Have your blood tested for lipids and cholesterol at 58 years of age, then have this test every 5 years. Have your cholesterol levels checked more often if:  Your lipid or cholesterol levels are high.  You are older than 58 years of age.  You are at high risk for heart disease. What should I know about cancer screening? Depending on your health history and family history, you may need to have cancer screening at various ages. This may include screening for:  Breast cancer.  Cervical cancer.  Colorectal cancer.  Skin cancer.  Lung cancer. What should I know about heart disease, diabetes, and high blood  pressure? Blood pressure and heart disease  High blood pressure causes heart disease and increases the risk of stroke. This is more likely to develop in people who have high blood pressure readings, are of African descent, or are overweight.  Have your blood pressure checked: ? Every 3-5 years if you are 18-39 years of age. ? Every year if you are 40 years old or older. Diabetes Have regular diabetes screenings. This checks your fasting blood sugar level. Have the screening done:  Once every three years after age 40 if you are at a normal weight and have a low risk for diabetes.  More often and at a younger age if you are overweight or have a high risk for diabetes. What should I know about preventing infection? Hepatitis B If you have a higher risk for hepatitis B, you should be screened for this virus. Talk with your health care provider to find out if you are at risk for hepatitis B infection. Hepatitis C Testing is recommended for:  Everyone born from 1945 through 1965.  Anyone with known risk factors for hepatitis C. Sexually transmitted infections (STIs)  Get screened for STIs, including gonorrhea and chlamydia, if: ? You are sexually active and are younger than 58 years of age. ? You are older than 58 years of age and your health care provider tells you that you are at risk for this type of infection. ? Your sexual activity has changed since you were last screened, and you are at increased risk for chlamydia or gonorrhea. Ask your health care provider if   you are at risk.  Ask your health care provider about whether you are at high risk for HIV. Your health care provider may recommend a prescription medicine to help prevent HIV infection. If you choose to take medicine to prevent HIV, you should first get tested for HIV. You should then be tested every 3 months for as long as you are taking the medicine. Pregnancy  If you are about to stop having your period (premenopausal) and  you may become pregnant, seek counseling before you get pregnant.  Take 400 to 800 micrograms (mcg) of folic acid every day if you become pregnant.  Ask for birth control (contraception) if you want to prevent pregnancy. Osteoporosis and menopause Osteoporosis is a disease in which the bones lose minerals and strength with aging. This can result in bone fractures. If you are 65 years old or older, or if you are at risk for osteoporosis and fractures, ask your health care provider if you should:  Be screened for bone loss.  Take a calcium or vitamin D supplement to lower your risk of fractures.  Be given hormone replacement therapy (HRT) to treat symptoms of menopause. Follow these instructions at home: Lifestyle  Do not use any products that contain nicotine or tobacco, such as cigarettes, e-cigarettes, and chewing tobacco. If you need help quitting, ask your health care provider.  Do not use street drugs.  Do not share needles.  Ask your health care provider for help if you need support or information about quitting drugs. Alcohol use  Do not drink alcohol if: ? Your health care provider tells you not to drink. ? You are pregnant, may be pregnant, or are planning to become pregnant.  If you drink alcohol: ? Limit how much you use to 0-1 drink a day. ? Limit intake if you are breastfeeding.  Be aware of how much alcohol is in your drink. In the [place] one drink equals one 12 oz bottle of beer (355 mL), one 5 oz glass of wine (148 mL), or one 1 oz glass of hard liquor (44 mL). General instructions  Schedule regular health, dental, and eye exams.  Stay current with your vaccines.  Tell your health care provider if: ? You often feel depressed. ? You have ever been abused or do not feel safe at home. Summary  Adopting a healthy lifestyle and getting preventive care are important in promoting health and wellness.  Follow your health care provider's instructions about healthy  diet, exercising, and getting tested or screened for diseases.  Follow your health care provider's instructions on monitoring your cholesterol and blood pressure. This information is not intended to replace advice given to you by your health care provider. Make sure you discuss any questions you have with your health care provider. Document Revised: 10/26/2018 Document Reviewed: 10/26/2018 Elsevier Patient Education  2020 Elsevier Inc.  Influenza Virus Vaccine (Flucelvax) What is this medicine? INFLUENZA VIRUS VACCINE (in floo EN zuh VAHY ruhs vak SEEN) helps to reduce the risk of getting influenza also known as the flu. The vaccine only helps protect you against some strains of the flu. This medicine may be used for other purposes; ask your health care provider or pharmacist if you have questions. COMMON BRAND NAME(S): FLUCELVAX What should I tell my health care provider before I take this medicine? They need to know if you have any of these conditions:  bleeding disorder like hemophilia  fever or infection  Guillain-Barre syndrome or other neurological problems    immune system problems  infection with the human immunodeficiency virus (HIV) or AIDS  low blood platelet counts  multiple sclerosis  an unusual or allergic reaction to influenza virus vaccine, other medicines, foods, dyes or preservatives  pregnant or trying to get pregnant  breast-feeding How should I use this medicine? This vaccine is for injection into a muscle. It is given by a health care professional. A copy of Vaccine Information Statements will be given before each vaccination. Read this sheet carefully each time. The sheet may change frequently. Talk to your pediatrician regarding the use of this medicine in children. Special care may be needed. Overdosage: If you think you've taken too much of this medicine contact a poison control center or emergency room at once. Overdosage: If you think you have taken  too much of this medicine contact a poison control center or emergency room at once. NOTE: This medicine is only for you. Do not share this medicine with others. What if I miss a dose? This does not apply. What may interact with this medicine?  chemotherapy or radiation therapy  medicines that lower your immune system like etanercept, anakinra, infliximab, and adalimumab  medicines that treat or prevent blood clots like warfarin  phenytoin  steroid medicines like prednisone or cortisone  theophylline  vaccines This list may not describe all possible interactions. Give your health care provider a list of all the medicines, herbs, non-prescription drugs, or dietary supplements you use. Also tell them if you smoke, drink alcohol, or use illegal drugs. Some items may interact with your medicine. What should I watch for while using this medicine? Report any side effects that do not go away within 3 days to your doctor or health care professional. Call your health care provider if any unusual symptoms occur within 6 weeks of receiving this vaccine. You may still catch the flu, but the illness is not usually as bad. You cannot get the flu from the vaccine. The vaccine will not protect against colds or other illnesses that may cause fever. The vaccine is needed every year. What side effects may I notice from receiving this medicine? Side effects that you should report to your doctor or health care professional as soon as possible:  allergic reactions like skin rash, itching or hives, swelling of the face, lips, or tongue Side effects that usually do not require medical attention (Report these to your doctor or health care professional if they continue or are bothersome.):  fever  headache  muscle aches and pains  pain, tenderness, redness, or swelling at the injection site  tiredness This list may not describe all possible side effects. Call your doctor for medical advice about side  effects. You may report side effects to FDA at 1-800-FDA-1088. Where should I keep my medicine? The vaccine will be given by a health care professional in a clinic, pharmacy, doctor's office, or other health care setting. You will not be given vaccine doses to store at home. NOTE: This sheet is a summary. It may not cover all possible information. If you have questions about this medicine, talk to your doctor, pharmacist, or health care provider.  2020 Elsevier/Gold Standard (2011-10-14 14:06:47)  

## 2020-11-06 NOTE — Progress Notes (Signed)
I,Yamilka Roman [employer] as a Education administrator for [employer], FNP.,have documented all relevant documentation on the behalf of [name], FNP,as directed by  [name], FNP while in the presence of [name], [place]. This visit occurred during the SARS-CoV-2 public health emergency.  Safety protocols were in place, including screening questions prior to the visit, additional usage of staff PPE, and extensive cleaning of exam room while observing appropriate contact time as indicated for disinfecting solutions.  Subjective:     Patient ID: Virginia [name] , female    DOB: 02/06/1961 , 59 y.o.   MRN: [id number]   Chief Complaint  Patient presents with  . Annual Exam    HPI  Here for HM.   Wt Readings from Last 3 Encounters: 11/06/20 : 199 lb (90.3 kg) 04/24/20 : 193 lb (87.5 kg) 04/24/20 : 193 lb 3.2 oz (87.6 kg)  Reports she used to have proventil for her breathing. She has quit smoking and had a low dose CT scan this year which was negative.     Past Medical History:  Diagnosis Date  . Anxiety   . Arthritis   . Chest pain, unspecified   . Dermatitis   . HLD (hyperlipidemia)    no current med.  . Insomnia   . Loss of hair   . Low back pain   . Major depressive disorder, single episode, mild ([place])   . Personal history of colonic polyps   . Shoulder impingement 06/2012   left  . Vitamin D deficiency   . Wears dentures    upper denture  . Wears partial dentures    lower partial     Family History  Problem Relation Age of Onset  . Heart attack Other   . Hypertension Other   . Diabetes Other   . COPD Other   . COPD Mother   . Heart disease Mother   . Diabetes Mother   . Depression Mother   . Hypothyroidism Father      Current Outpatient Medications:  .  ARIPiprazole (ABILIFY) 20 MG tablet, Take 20 mg by mouth daily. , Disp: , Rfl:  .  atorvastatin (LIPITOR) 40 MG tablet, One tab po Monday - Friday, Disp: 75 tablet, Rfl: 1 .  desvenlafaxine (PRISTIQ) 100 MG  24 hr tablet, TAKE 1 TABLET BY MOUTH EVERY DAY. NEEDS APPOINTMENT, Disp: 30 tablet, Rfl: 2 .  Vitamin D, Cholecalciferol, 50 MCG (2000 UT) CAPS, Take by mouth., Disp: , Rfl:  .  zolpidem (AMBIEN) 10 MG tablet, Take 10 mg by mouth at bedtime., Disp: , Rfl:  .  albuterol (VENTOLIN HFA) 108 (90 Base) MCG/ACT inhaler, Inhale 2 puffs into the lungs every 6 (six) hours as needed for wheezing or shortness of breath., Disp: 8 g, Rfl: 2 .  Tdap (BOOSTRIX) 5-2.5-18.5 LF-MCG/0.5 injection, Inject 0.5 mLs into the muscle once for 1 dose., Disp: 0.5 mL, Rfl: 0   Allergies  Allergen Reactions  . Doxycycline Itching  . Penicillins Hives and Swelling    SWELLING OF TONGUE SWELLING OF JOINTS  . Rosuvastatin Other (See Comments)    MUSCLE ACHES      The patient states she uses status post hysterectomy for birth control. Last LMP was No LMP recorded. Patient has had a hysterectomy.. Negative for Dysmenorrhea and Negative for Menorrhagia Negative for: breast discharge, breast lump(s), breast pain and breast self exam. Associated symptoms include abnormal vaginal bleeding. Pertinent negatives include abnormal bleeding (hematology), anxiety, decreased libido, depression, difficulty falling sleep,  dyspareunia, history of infertility, nocturia, sexual dysfunction, sleep disturbances, urinary incontinence, urinary urgency, vaginal discharge and vaginal itching. Diet regular. The patient states her exercise level is minimal.   The patient's tobacco use is:  Social History   Tobacco Use  Smoking Status Former Smoker  . Packs/day: 2.00  . Years: 40.00  . Pack years: 80.00  Smokeless Tobacco Never Used  Tobacco Comment   quit smoking 02/2012   She has been exposed to passive smoke. The patient's alcohol use is:  Social History   Substance and Sexual Activity  Alcohol Use Yes   Comment: moderate, socially  Additional information: she has had a hysterectomy.    Review of Systems  Constitutional: Negative.    HENT: Negative.   Eyes: Negative.   Respiratory: Positive for shortness of breath. Negative for cough.   Cardiovascular: Negative.   Gastrointestinal: Negative.   Endocrine: Negative.   Genitourinary: Negative.   Musculoskeletal: Negative.   Skin: Negative.   Allergic/Immunologic: Negative.   Neurological: Negative.   Hematological: Negative.   Psychiatric/Behavioral: Negative.      Today's Vitals   11/06/20 1402  BP: 122/74  Pulse: 78  Temp: 97.9 F (36.6 C)  TempSrc: Oral  Weight: 199 lb (90.3 kg)  Height: 5\' 3"  (1.6 m)  PainSc: 0-No pain   Body mass index is 35.25 kg/m.   Objective:  Physical Exam Vitals reviewed.  Constitutional:      General: She is not in acute distress.    Appearance: Normal appearance. She is well-developed. She is obese.  HENT:     Head: Normocephalic and atraumatic.     Right Ear: Hearing, tympanic membrane, ear canal and external ear normal. There is no impacted cerumen.     Left Ear: Hearing, tympanic membrane, ear canal and external ear normal. There is no impacted cerumen.     Nose:     Comments: Deferred - masked    Mouth/Throat:     Comments: Deferred - masked Eyes:     General: Lids are normal.     Extraocular Movements: Extraocular movements intact.     Conjunctiva/sclera: Conjunctivae normal.     Pupils: Pupils are equal, round, and reactive to light.     Funduscopic exam:    Right eye: No papilledema.        Left eye: No papilledema.  Neck:     Thyroid: No thyroid mass.     Vascular: No carotid bruit.  Cardiovascular:     Rate and Rhythm: Normal rate and regular rhythm.     Pulses: Normal pulses.     Heart sounds: Normal heart sounds. No murmur heard.   Pulmonary:     Effort: Pulmonary effort is normal. No respiratory distress.     Breath sounds: Normal breath sounds. No wheezing.  Chest:     Chest wall: No mass.  Breasts:     Tanner Score is 5.     Right: Normal. No mass, tenderness, axillary adenopathy or  supraclavicular adenopathy.     Left: Normal. No mass, tenderness, axillary adenopathy or supraclavicular adenopathy.      Comments: She has a tattoo on her right breast Abdominal:     General: Abdomen is flat. Bowel sounds are normal. There is no distension.     Palpations: Abdomen is soft.     Tenderness: There is no abdominal tenderness.  Genitourinary:    Rectum: Guaiac result negative.  Musculoskeletal:        General: No swelling. Normal  range of motion.     Cervical back: Full passive range of motion without pain, normal range of motion and neck supple.     Right lower leg: No edema.     Left lower leg: No edema.  Lymphadenopathy:     Upper Body:     Right upper body: No supraclavicular, axillary or pectoral adenopathy.     Left upper body: No supraclavicular, axillary or pectoral adenopathy.  Skin:    General: Skin is warm and dry.     Capillary Refill: Capillary refill takes less than 2 seconds.     Coloration: Skin is not jaundiced.     Findings: No bruising.  Neurological:     General: No focal deficit present.     Mental Status: She is alert and oriented to person, place, and time.     Cranial Nerves: No cranial nerve deficit.     Sensory: No sensory deficit.  Psychiatric:        Mood and Affect: Mood normal.        Behavior: Behavior normal.        Thought Content: Thought content normal.        Judgment: Judgment normal.         Assessment And Plan:     1. Encounter for general adult medical examination w/o abnormal findings . Behavior modifications discussed and diet history reviewed.   . Pt will continue to exercise regularly and modify diet with low GI, plant based foods and decrease intake of processed foods.  . Recommend intake of daily multivitamin, Vitamin D, and calcium.  . Recommend mammogram and colonoscopy for preventive screenings, as well as recommend immunizations that include influenza, TDAP (will se  2. Pure hypercholesterolemia  Chronic,  controlled  Continue with current medications, tolerating well  3. Vitamin D deficiency  Will check vitamin D level and supplement as needed.     Also encouraged to spend 15 minutes in the sun daily.   4. Other abnormal glucose  Will check HgbA1c  Encouraged to avoid sugary foods and drinks.   5. Need for influenza vaccination  Influenza vaccine administered  Encouraged to take Tylenol as needed for fever or muscle aches. - Flu Vaccine QUAD 6+ mos PF IM (Fluarix Quad PF)  6. Encounter for immunization  Rx sent to pharmacy. TDAP will be administered to adults 53-41 years old every 10 years. - Tdap (BOOSTRIX) 5-2.5-18.5 LF-MCG/0.5 injection; Inject 0.5 mLs into the muscle once for 1 dose.  Dispense: 0.5 mL; Refill: 0  7. History of COPD  She is advised if she uses her albuterol inhaler more than 3 times a week she needs to call office, she may need steroids and/or a chest xray  Return in 6 weeks for medication check - albuterol (VENTOLIN HFA) 108 (90 Base) MCG/ACT inhaler; Inhale 2 puffs into the lungs every 6 (six) hours as needed for wheezing or shortness of breath.  Dispense: 8 g; Refill: 2     Patient was given opportunity to ask questions. Patient verbalized understanding of the plan and was able to repeat key elements of the plan. All questions were answered to their satisfaction.   [name], FNP   I, [name], FNP, have reviewed all documentation for this visit. The documentation on 11/06/20 for the exam, diagnosis, procedures, and orders are all accurate and complete.   THE PATIENT IS ENCOURAGED TO PRACTICE SOCIAL DISTANCING DUE TO THE COVID-19 PANDEMIC.

## 2020-11-07 LAB — CBC
Hematocrit: 40.5 % (ref 34.0–46.6)
Hemoglobin: 13.9 g/dL (ref 11.1–15.9)
MCH: 32 pg (ref 26.6–33.0)
MCHC: 34.3 g/dL (ref 31.5–35.7)
MCV: 93 fL (ref 79–97)
Platelets: 218 10*3/uL (ref 150–450)
RBC: 4.34 x10E6/uL (ref 3.77–5.28)
RDW: 12.5 % (ref 11.7–15.4)
WBC: 9.3 10*3/uL (ref 3.4–10.8)

## 2020-11-07 LAB — VITAMIN D 25 HYDROXY (VIT D DEFICIENCY, FRACTURES): Vit D, 25-Hydroxy: 24.4 ng/mL — ABNORMAL LOW (ref 30.0–100.0)

## 2020-11-07 LAB — CMP14+EGFR
ALT: 41 IU/L — ABNORMAL HIGH (ref 0–32)
AST: 28 IU/L (ref 0–40)
Albumin/Globulin Ratio: 2.2 (ref 1.2–2.2)
Albumin: 4.9 g/dL (ref 3.8–4.9)
Alkaline Phosphatase: 105 IU/L (ref 44–121)
BUN/Creatinine Ratio: 21 (ref 9–23)
BUN: 17 mg/dL (ref 6–24)
Bilirubin Total: 0.3 mg/dL (ref 0.0–1.2)
CO2: 22 mmol/L (ref 20–29)
Calcium: 9.4 mg/dL (ref 8.7–10.2)
Chloride: 100 mmol/L (ref 96–106)
Creatinine, Ser: 0.82 mg/dL (ref 0.57–1.00)
GFR calc Af Amer: 91 mL/min/{1.73_m2} (ref >59)
GFR calc non Af Amer: 79 mL/min/{1.73_m2} (ref >59)
Globulin, Total: 2.2 g/dL (ref 1.5–4.5)
Glucose: 86 mg/dL (ref 65–99)
Potassium: 4.1 mmol/L (ref 3.5–5.2)
Sodium: 139 mmol/L (ref 134–144)
Total Protein: 7.1 g/dL (ref 6.0–8.5)

## 2020-11-07 LAB — LIPID PANEL
Chol/HDL Ratio: 3.4 ratio (ref 0.0–4.4)
Cholesterol, Total: 246 mg/dL — ABNORMAL HIGH (ref 100–199)
HDL: 72 mg/dL (ref >39)
LDL Chol Calc (NIH): 144 mg/dL — ABNORMAL HIGH (ref 0–99)
Triglycerides: 170 mg/dL — ABNORMAL HIGH (ref 0–149)
VLDL Cholesterol Cal: 30 mg/dL (ref 5–40)

## 2020-11-07 LAB — HEMOGLOBIN A1C
Est. average glucose Bld gHb Est-mCnc: 108 mg/dL
Hgb A1c MFr Bld: 5.4 % (ref 4.8–5.6)

## 2020-11-12 ENCOUNTER — Other Ambulatory Visit: Payer: Self-pay

## 2020-11-12 ENCOUNTER — Telehealth: Payer: Self-pay

## 2020-11-12 ENCOUNTER — Encounter: Payer: Self-pay | Admitting: Nurse Practitioner

## 2020-11-12 ENCOUNTER — Encounter: Payer: Self-pay | Admitting: Internal Medicine

## 2020-11-12 DIAGNOSIS — Z0000 Encounter for general adult medical examination without abnormal findings: Secondary | ICD-10-CM

## 2020-11-12 MED ORDER — VITAMIN D (ERGOCALCIFEROL) 1.25 MG (50000 UNIT) PO CAPS: 50000.0000 [IU] | ORAL_CAPSULE | ORAL | 3 refills | Status: DC

## 2020-11-12 NOTE — Telephone Encounter (Signed)
I left the pt a message that I called because she hasn't responded to the questions in her lab results and if she could please respond to the questions.

## 2020-11-12 NOTE — Telephone Encounter (Signed)
-----   Message from Arnette Felts, FNP sent at 11/11/2020  5:41 PM EST ----- Your blood levels are normal. HgbA1c is normal. Cholesterol levels remain elevated are you taking your atorvastatin as directed? Kidney functions are normal. Liver functions are slightly elevated, limit your intake of processed foods.  Vitamin d is low at 24.4 are you taking your vitamin d daily? We can send a high dose vitamin d to take once a week if you would like?

## 2020-11-13 ENCOUNTER — Other Ambulatory Visit: Payer: Self-pay

## 2020-11-13 ENCOUNTER — Ambulatory Visit (INDEPENDENT_AMBULATORY_CARE_PROVIDER_SITE_OTHER): Payer: Medicare Other | Admitting: Nurse Practitioner

## 2020-11-13 ENCOUNTER — Encounter: Payer: Self-pay | Admitting: Nurse Practitioner

## 2020-11-13 VITALS — BP 132/78 | HR 82 | Temp 98.3°F | Ht 63.0 in | Wt 196.4 lb

## 2020-11-13 DIAGNOSIS — R030 Elevated blood-pressure reading, without diagnosis of hypertension: Secondary | ICD-10-CM

## 2020-11-13 DIAGNOSIS — Z6834 Body mass index (BMI) 34.0-34.9, adult: Secondary | ICD-10-CM | POA: Diagnosis not present

## 2020-11-13 DIAGNOSIS — E6609 Other obesity due to excess calories: Secondary | ICD-10-CM

## 2020-11-13 NOTE — Patient Instructions (Signed)

## 2020-11-13 NOTE — Progress Notes (Signed)
I,Tianna Badgett,acting as a Neurosurgeon for [employer], NP.,have documented all relevant documentation on the behalf of [employer], NP,as directed by  Charlesetta Ivory, NP while in the presence of Charlesetta Ivory, NP.  This visit occurred during the SARS-CoV-2 public health emergency.  Safety protocols were in place, including screening questions prior to the visit, additional usage of staff PPE, and extensive cleaning of exam room while observing appropriate contact time as indicated for disinfecting solutions.  Subjective:     Patient ID: Virginia [name] , female    DOB: 1961-07-23 , 59 y.o.   MRN: [id number]   Chief Complaint  Patient presents with  . Hypertension    HPI  Patient is here today for elevated blood pressure readings at home 143/90.  Yesterday her blood pressure was 160/88 but she admits that she had had a lot of ham before this reading.  She also used a wrist BP cuff which she thinks might not be as accurate comparing to the manual BP cuff.   BP Readings from Last 3 Encounters: 11/13/20 : 132/78 11/06/20 : 122/74 04/24/20 : 118/70   Hypertension This is a new problem. The problem has been gradually improving since onset. The problem is controlled. Associated symptoms include headaches. Pertinent negatives include no chest pain, palpitations or shortness of breath. There are no associated agents to hypertension. Risk factors for coronary artery disease include obesity. Compliance problems include diet.      Past Medical History:  Diagnosis Date  . Anxiety   . Arthritis   . Chest pain, unspecified   . Dermatitis   . HLD (hyperlipidemia)    no current med.  . Insomnia   . Loss of hair   . Low back pain   . Major depressive disorder, single episode, mild (HCC)   . Personal history of colonic polyps   . Shoulder impingement 06/2012   left  . Vitamin D deficiency   . Wears dentures    upper denture  . Wears partial dentures    lower partial      Family History  Problem Relation Age of Onset  . Heart attack Other   . Hypertension Other   . Diabetes Other   . COPD Other   . COPD Mother   . Heart disease Mother   . Diabetes Mother   . Depression Mother   . Hypothyroidism Father      Current Outpatient Medications:  .  albuterol (VENTOLIN HFA) 108 (90 Base) MCG/ACT inhaler, Inhale 2 puffs into the lungs every 6 (six) hours as needed for wheezing or shortness of breath., Disp: 8 g, Rfl: 2 .  ARIPiprazole (ABILIFY) 20 MG tablet, Take 20 mg by mouth daily. , Disp: , Rfl:  .  atorvastatin (LIPITOR) 40 MG tablet, One tab po Monday - Friday, Disp: 75 tablet, Rfl: 1 .  desvenlafaxine (PRISTIQ) 100 MG 24 hr tablet, TAKE 1 TABLET BY MOUTH EVERY DAY. NEEDS APPOINTMENT, Disp: 30 tablet, Rfl: 2 .  Vitamin D, Cholecalciferol, 50 MCG (2000 UT) CAPS, Take by mouth., Disp: , Rfl:  .  Vitamin D, Ergocalciferol, (DRISDOL) 1.25 MG (50000 UNIT) CAPS capsule, Take 1 capsule (50,000 Units total) by mouth every 7 (seven) days., Disp: 15 capsule, Rfl: 3 .  zolpidem (AMBIEN) 10 MG tablet, Take 10 mg by mouth at bedtime., Disp: , Rfl:    Allergies  Allergen Reactions  . Doxycycline Itching  . Penicillins Hives and Swelling    SWELLING OF TONGUE SWELLING OF JOINTS  .  Rosuvastatin Other (See Comments)    MUSCLE ACHES     Review of Systems  Constitutional: Negative.   Respiratory: Negative.  Negative for shortness of breath.   Cardiovascular: Negative.  Negative for chest pain and palpitations.  Gastrointestinal: Negative.   Neurological: Positive for headaches.     Today's Vitals   11/13/20 1426  BP: 132/78  Pulse: 82  Temp: 98.3 F (36.8 C)  TempSrc: Oral  Weight: 196 lb 6.4 oz (89.1 kg)  Height: 5\' 3"  (1.6 m)   Body mass index is 34.79 kg/m.  Wt Readings from Last 3 Encounters:  11/13/20 196 lb 6.4 oz (89.1 kg)  11/06/20 199 lb (90.3 kg)  04/24/20 193 lb (87.5 kg)     Objective:  Physical Exam Vitals reviewed.   Constitutional:      Appearance: Normal appearance. She is obese.  HENT:     Head: Normocephalic and atraumatic.  Cardiovascular:     Rate and Rhythm: Normal rate and regular rhythm.     Pulses: Normal pulses.     Heart sounds: Normal heart sounds. No murmur heard.   Pulmonary:     Effort: Pulmonary effort is normal. No respiratory distress.     Breath sounds: Normal breath sounds. No wheezing.  Neurological:     Mental Status: She is alert.         Assessment And Plan:     1. Elevated blood pressure reading -In office reading was 132/78 at today's visit.  -Patient will keep a log of her BP reading daily and notify me if readings are elevated  -Patient will bring in her BP cuff with her on her next visit so we can compare it to ours -Patient educated on avoiding processed foods and low sodium diet.  -Patient educated on seeking ED care if BP elevated and she has SOB, chest pain.   2. Class 1 obesity due to excess calories with serious comorbidity and body mass index (BMI) of 34.0 to 34.9 in adult She is encouraged to strive for BMI less than 30 to decrease cardiac risk. Advised to aim for at least 150 minutes of exercise per week.    Patient was given opportunity to ask questions. Patient verbalized understanding of the plan and was able to repeat key elements of the plan. All questions were answered to their satisfaction.  06/24/20, NP   I, Charlesetta Ivory, NP, have reviewed all documentation for this visit. The documentation on 11/13/20 for the exam, diagnosis, procedures, and orders are all accurate and complete.  THE PATIENT IS ENCOURAGED TO PRACTICE SOCIAL DISTANCING DUE TO THE COVID-19 PANDEMIC.

## 2020-11-25 DIAGNOSIS — G479 Sleep disorder, unspecified: Secondary | ICD-10-CM | POA: Insufficient documentation

## 2020-11-25 DIAGNOSIS — F411 Generalized anxiety disorder: Secondary | ICD-10-CM | POA: Insufficient documentation

## 2020-12-18 ENCOUNTER — Ambulatory Visit: Payer: Medicare Other | Admitting: Internal Medicine

## 2020-12-26 DIAGNOSIS — Z1211 Encounter for screening for malignant neoplasm of colon: Secondary | ICD-10-CM | POA: Diagnosis not present

## 2020-12-26 DIAGNOSIS — Z86010 Personal history of colonic polyps: Secondary | ICD-10-CM | POA: Diagnosis not present

## 2020-12-26 DIAGNOSIS — D122 Benign neoplasm of ascending colon: Secondary | ICD-10-CM | POA: Diagnosis not present

## 2020-12-26 DIAGNOSIS — K635 Polyp of colon: Secondary | ICD-10-CM | POA: Diagnosis not present

## 2020-12-26 DIAGNOSIS — D123 Benign neoplasm of transverse colon: Secondary | ICD-10-CM | POA: Diagnosis not present

## 2020-12-26 DIAGNOSIS — K5730 Diverticulosis of large intestine without perforation or abscess without bleeding: Secondary | ICD-10-CM | POA: Diagnosis not present

## 2020-12-26 DIAGNOSIS — D120 Benign neoplasm of cecum: Secondary | ICD-10-CM | POA: Diagnosis not present

## 2020-12-26 LAB — HM COLONOSCOPY

## 2020-12-29 ENCOUNTER — Other Ambulatory Visit: Payer: Self-pay | Admitting: Internal Medicine

## 2021-01-29 ENCOUNTER — Telehealth: Payer: Self-pay

## 2021-01-29 NOTE — Telephone Encounter (Signed)
The pt was notified that her insurance doesn't cover the atorvastatin anymore and that [employer] has a 90 days supply for $21.74 cash price.  The pt is going to call [facility] and [facility] for their cash price and call the office back.

## 2021-05-01 ENCOUNTER — Ambulatory Visit: Payer: Medicare Other | Admitting: Internal Medicine

## 2021-05-01 ENCOUNTER — Ambulatory Visit (INDEPENDENT_AMBULATORY_CARE_PROVIDER_SITE_OTHER): Payer: Medicare Other

## 2021-05-01 ENCOUNTER — Ambulatory Visit: Payer: Medicare Other

## 2021-05-01 VITALS — Ht 63.0 in | Wt 197.0 lb

## 2021-05-01 DIAGNOSIS — Z0000 Encounter for general adult medical examination without abnormal findings: Secondary | ICD-10-CM

## 2021-05-01 DIAGNOSIS — Z122 Encounter for screening for malignant neoplasm of respiratory organs: Secondary | ICD-10-CM

## 2021-05-01 NOTE — Progress Notes (Signed)
I connected with Virginia [name] today by telephone and verified that I am speaking with the correct person using two identifiers. Location patient: home Location provider: work Persons participating in the virtual visit: Jabree Pernice, Glenna Durand LPN.   I discussed the limitations, risks, security and privacy concerns of performing an evaluation and management service by telephone and the availability of in person appointments. I also discussed with the patient that there may be a patient responsible charge related to this service. The patient expressed understanding and verbally consented to this telephonic visit.    Interactive audio and video telecommunications were attempted between this provider and patient, however failed, due to patient having technical difficulties OR patient did not have access to video capability.  We continued and completed visit with audio only.     Vital signs may be patient reported or missing.  Subjective:   Virginia [name] is a 59 y.o. female who presents for Medicare Annual (Subsequent) preventive examination.  Review of Systems     Cardiac Risk Factors include: obesity (BMI >30kg/m2);sedentary lifestyle     Objective:    Today's Vitals   05/01/21 1356  Weight: 197 lb (89.4 kg)  Height: 5\' 3"  (1.6 m)   Body mass index is 34.9 kg/m.  Advanced Directives 05/01/2021 04/24/2020 04/12/2019 07/14/2012  Does Patient Have a Medical Advance Directive? Yes Yes Yes Patient does not have advance directive;Patient would not like information  Type of Scientist, forensic Power of [place];Living will [place];Living will [place];Living will -  Copy of [place] in Chart? No - copy requested No - copy requested No - copy requested -    Current Medications (verified) Outpatient Encounter Medications as of 05/01/2021  Medication Sig   albuterol (VENTOLIN HFA) 108 (90 Base) MCG/ACT inhaler Inhale 2  puffs into the lungs every 6 (six) hours as needed for wheezing or shortness of breath.   atorvastatin (LIPITOR) 40 MG tablet TAKE 1 TABLET BY MOUTH MONDAY THRU FRIDAY   FLUoxetine (PROZAC) 20 MG tablet Take 20 mg by mouth daily.   Vitamin D, Ergocalciferol, (DRISDOL) 1.25 MG (50000 UNIT) CAPS capsule Take 1 capsule (50,000 Units total) by mouth every 7 (seven) days.   zolpidem (AMBIEN) 10 MG tablet Take 10 mg by mouth at bedtime.   ARIPiprazole (ABILIFY) 20 MG tablet Take 20 mg by mouth daily.  (Patient not taking: Reported on 05/01/2021)   desvenlafaxine (PRISTIQ) 100 MG 24 hr tablet TAKE 1 TABLET BY MOUTH EVERY DAY. NEEDS APPOINTMENT (Patient not taking: Reported on 05/01/2021)   Vitamin D, Cholecalciferol, 50 MCG (2000 UT) CAPS Take by mouth. (Patient not taking: Reported on 05/01/2021)   No facility-administered encounter medications on file as of 05/01/2021.    Allergies (verified) Doxycycline, Penicillins, and Rosuvastatin   History: Past Medical History:  Diagnosis Date   Anxiety    Arthritis    Chest pain, unspecified    Dermatitis    HLD (hyperlipidemia)    no current med.   Insomnia    Loss of hair    Low back pain    Major depressive disorder, single episode, mild (HCC)    Personal history of colonic polyps    Shoulder impingement 06/2012   left   Vitamin D deficiency    Wears dentures    upper denture   Wears partial dentures    lower partial   Past Surgical History:  Procedure Laterality Date   ABDOMINAL HYSTERECTOMY  1990s  partial   APPENDECTOMY  06/08/2006   CARPAL TUNNEL RELEASE  01/14/2005   left; with left cubital tunnel release   EXPLORATORY LAPAROTOMY WITH ABDOMINAL MASS EXCISION  06/08/2006   resection right ovarian mass, partial omentectomy   FULKERSON SLIDE  10/19/2007   right knee   KNEE ARTHROSCOPY  05/28/2010   left   KNEE ARTHROSCOPY     right   SALPINGOOPHORECTOMY  06/08/2006   bilat.   TOTAL KNEE ARTHROPLASTY  05/04/2011   left   Family  History  Problem Relation Age of Onset   Heart attack Other    Hypertension Other    Diabetes Other    COPD Other    COPD Mother    Heart disease Mother    Diabetes Mother    Depression Mother    Hypothyroidism Father    Social History   Socioeconomic History   Marital status: Single    Spouse name: Not on file   Number of children: Not on file   Years of education: Not on file   Highest education level: Not on file  Occupational History   Occupation: disability  Tobacco Use   Smoking status: Former    Packs/day: 2.00    Years: 40.00    Pack years: 80.00    Types: Cigarettes   Smokeless tobacco: Never   Tobacco comments:    quit smoking 02/2012  Vaping Use   Vaping Use: Never used  Substance and Sexual Activity   Alcohol use: Yes    Comment: moderate, socially   Drug use: No   Sexual activity: Not Currently  Other Topics Concern   Not on file  Social History Narrative   Not on file   Social Determinants of Health   Financial Resource Strain: Low Risk    Difficulty of Paying Living Expenses: Not hard at all  Food Insecurity: No Food Insecurity   Worried About Charity fundraiser in the Last Year: Never true   [place] in the Last Year: Never true  Transportation Needs: No Transportation Needs   Lack of Transportation (Medical): No   Lack of Transportation (Non-Medical): No  Physical Activity: Inactive   Days of Exercise per Week: 0 days   Minutes of Exercise per Session: 0 min  Stress: No Stress Concern Present   Feeling of Stress : Not at all  Social Connections: Not on file    Tobacco Counseling Counseling given: Not Answered Tobacco comments: quit smoking 02/2012   Clinical Intake:  Pre-visit preparation completed: Yes  Pain : No/denies pain     Nutritional Status: BMI > 30  Obese Nutritional Risks: None Diabetes: No  How often do you need to have someone help you when you read instructions, pamphlets, or other written materials  from your doctor or pharmacy?: 1 - Never What is the last grade level you completed in school?: GED  Diabetic? no  Interpreter Needed?: No  Information entered by :: NAllen LPN   Activities of Daily Living In your present state of health, do you have any difficulty performing the following activities: 05/01/2021  Hearing? N  Vision? N  Difficulty concentrating or making decisions? N  Walking or climbing stairs? N  Dressing or bathing? N  Doing errands, shopping? N  Preparing Food and eating ? N  Using the Toilet? N  In the past six months, have you accidently leaked urine? Y  Do you have problems with loss of bowel control? N  Managing your  Medications? N  Managing your Finances? N  Housekeeping or managing your Housekeeping? N  Some recent data might be hidden    Patient Care Team: Glendale Chard, MD as PCP - General (Internal Medicine)  Indicate any recent Medical Services you may have received from other than [facility] providers in the past year (date may be approximate).     Assessment:   This is a routine wellness examination for Reagan.  Hearing/Vision screen Vision Screening - Comments:: Regular eye exams,   Dietary issues and exercise activities discussed: Current Exercise Habits: The patient does not participate in regular exercise at present   Goals Addressed             This Visit's Progress    Patient Stated       05/01/2021, wants to lose weight        Depression Screen PHQ 2/9 Scores 05/01/2021 11/06/2020 04/24/2020 11/06/2019 04/12/2019 12/05/2018 09/01/2018  PHQ - 2 Score 4 0 2 0 0 5 4  PHQ- 9 Score 6 0 2 - 1 17 14     Fall Risk Fall Risk  05/01/2021 04/24/2020 04/12/2019 09/01/2018  Falls in the past year? 0 0 0 No  Risk for fall due to : Medication side effect Medication side effect - -  Follow up Falls evaluation completed;Education provided;Falls prevention discussed Falls evaluation completed;Education provided;Falls prevention discussed Falls  prevention discussed -    FALL RISK PREVENTION PERTAINING TO THE HOME:  Any stairs in or around the home? Yes  If so, are there any without handrails? No  Home free of loose throw rugs in walkways, pet beds, electrical cords, etc? Yes  Adequate lighting in your home to reduce risk of falls? Yes   ASSISTIVE DEVICES UTILIZED TO PREVENT FALLS:  Life alert? No  Use of a cane, walker or w/c? No  Grab bars in the bathroom? Yes  Shower chair or bench in shower? No  Elevated toilet seat or a handicapped toilet? Yes   TIMED UP AND GO:  Was the test performed? No .       Cognitive Function:     6CIT Screen 05/01/2021 04/24/2020 04/12/2019  What Year? 0 points 0 points 0 points  What month? 0 points 0 points 0 points  What time? 0 points 0 points 0 points  Count back from 20 0 points 0 points 0 points  Months in reverse 0 points 0 points 0 points  Repeat phrase 2 points 4 points 0 points  Total Score 2 4 0    Immunizations Immunization History  Administered Date(s) Administered   DTaP 08/12/2010   Influenza Inj Mdck Quad With Preservative 10/25/2017   Influenza,inj,Quad PF,6+ Mos 11/06/2019, 11/06/2020   PFIZER(Purple Top)SARS-COV-2 Vaccination 02/06/2020, 02/27/2020, 09/15/2020    TDAP status: Due, Education has been provided regarding the importance of this vaccine. Advised may receive this vaccine at local pharmacy or Health Dept. Aware to provide a copy of the vaccination record if obtained from local pharmacy or Health Dept. Verbalized acceptance and understanding.  Flu Vaccine status: Up to date  Pneumococcal vaccine status: Due, Education has been provided regarding the importance of this vaccine. Advised may receive this vaccine at local pharmacy or Health Dept. Aware to provide a copy of the vaccination record if obtained from local pharmacy or Health Dept. Verbalized acceptance and understanding.  Covid-19 vaccine status: Completed vaccines  Qualifies for Shingles  Vaccine? Yes   Zostavax completed No   Shingrix Completed?: No.    Education has  been provided regarding the importance of this vaccine. Patient has been advised to call insurance company to determine out of pocket expense if they have not yet received this vaccine. Advised may also receive vaccine at local pharmacy or Health Dept. Verbalized acceptance and understanding.  Screening Tests Health Maintenance  Topic Date Due   Pneumococcal Vaccine 71-80 Years old (1 - PCV) Never done   Zoster Vaccines- Shingrix (1 of 2) Never done   TETANUS/TDAP  08/12/2020   COVID-19 Vaccine (4 - Booster for Pfizer series) 12/16/2020   INFLUENZA VACCINE  06/16/2021   MAMMOGRAM  12/20/2021   COLONOSCOPY (Pts 45-46yrs Insurance coverage will need to be confirmed)  12/27/2023   Hepatitis C Screening  Completed   HIV Screening  Completed   HPV VACCINES  Aged Out   PAP SMEAR-Modifier  Discontinued    Health Maintenance  Health Maintenance Due  Topic Date Due   Pneumococcal Vaccine 73-36 Years old (1 - PCV) Never done   Zoster Vaccines- Shingrix (1 of 2) Never done   TETANUS/TDAP  08/12/2020   COVID-19 Vaccine (4 - Booster for Pfizer series) 12/16/2020    Colorectal cancer screening: Type of screening: Colonoscopy. Completed 12/26/2020. Repeat every 3 years  Mammogram status: patient to schedule  Bone Density status: n/a  Lung Cancer Screening: (Low Dose CT Chest recommended if Age 29-80 years, 30 pack-year currently smoking OR have quit w/in 15years.) does qualify.   Lung Cancer Screening Referral: ordered today  Additional Screening:  Hepatitis C Screening: does qualify; Completed 12/05/2018  Vision Screening: Recommended annual ophthalmology exams for early detection of glaucoma and other disorders of the eye. Is the patient up to date with their annual eye exam?  No  Who is the provider or what is the name of the office in which the patient attends annual eye exams? Can't remember name If pt  is not established with a provider, would they like to be referred to a provider to establish care? No .   Dental Screening: Recommended annual dental exams for proper oral hygiene  Community Resource Referral / Chronic Care Management: CRR required this visit?  No   CCM required this visit?  No      Plan:     I have personally reviewed and noted the following in the patient's chart:   Medical and social history Use of alcohol, tobacco or illicit drugs  Current medications and supplements including opioid prescriptions.  Functional ability and status Nutritional status Physical activity Advanced directives List of other physicians Hospitalizations, surgeries, and ER visits in previous 12 months Vitals Screenings to include cognitive, depression, and falls Referrals and appointments  In addition, I have reviewed and discussed with patient certain preventive protocols, quality metrics, and best practice recommendations. A written personalized care plan for preventive services as well as general preventive health recommendations were provided to patient.     Kellie Simmering, LPN   [id number]   Nurse Notes:

## 2021-05-01 NOTE — Patient Instructions (Signed)
Virginia [name] , Thank you for taking time to come for your Medicare Wellness Visit. I appreciate your ongoing commitment to your health goals. Please review the following plan we discussed and let me know if I can assist you in the future.   Screening recommendations/referrals: Colonoscopy: completed 12/26/2020, due 12/27/2023 Mammogram: patient to schedule Bone Density: n/a Recommended yearly ophthalmology/optometry visit for glaucoma screening and checkup Recommended yearly dental visit for hygiene and checkup  Vaccinations: Influenza vaccine: completed 11/06/2020, due 06/16/2021 Pneumococcal vaccine: due Tdap vaccine: due Shingles vaccine: discussed  Covid-19:  09/15/2020, 02/27/2020, 02/06/2020  Advanced directives: Please bring a copy of your POA (Power of Attorney) and/or Living Will to your next appointment.   Conditions/risks identified: none  Next appointment: Follow up in one year for your annual wellness visit.   Preventive Care 40-64 Years, Female Preventive care refers to lifestyle choices and visits with your health care provider that can promote health and wellness. What does preventive care include? A yearly physical exam. This is also called an annual well check. Dental exams once or twice a year. Routine eye exams. Ask your health care provider how often you should have your eyes checked. Personal lifestyle choices, including: Daily care of your teeth and gums. Regular physical activity. Eating a healthy diet. Avoiding tobacco and drug use. Limiting alcohol use. Practicing safe sex. Taking low-dose aspirin daily starting at age 46. Taking vitamin and mineral supplements as recommended by your health care provider. What happens during an annual well check? The services and screenings done by your health care provider during your annual well check will depend on your age, overall health, lifestyle risk factors, and family history of disease. Counseling  Your health  care provider may ask you questions about your: Alcohol use. Tobacco use. Drug use. Emotional well-being. Home and relationship well-being. Sexual activity. Eating habits. Work and work Statistician. Method of birth control. Menstrual cycle. Pregnancy history. Screening  You may have the following tests or measurements: Height, weight, and BMI. Blood pressure. Lipid and cholesterol levels. These may be checked every 5 years, or more frequently if you are over 87 years old. Skin check. Lung cancer screening. You may have this screening every year starting at age 3 if you have a 30-pack-year history of smoking and currently smoke or have quit within the past 15 years. Fecal occult blood test (FOBT) of the stool. You may have this test every year starting at age 59. Flexible sigmoidoscopy or colonoscopy. You may have a sigmoidoscopy every 5 years or a colonoscopy every 10 years starting at age 74. Hepatitis C blood test. Hepatitis B blood test. Sexually transmitted disease (STD) testing. Diabetes screening. This is done by checking your blood sugar (glucose) after you have not eaten for a while (fasting). You may have this done every 1-3 years. Mammogram. This may be done every 1-2 years. Talk to your health care provider about when you should start having regular mammograms. This may depend on whether you have a family history of breast cancer. BRCA-related cancer screening. This may be done if you have a family history of breast, ovarian, tubal, or peritoneal cancers. Pelvic exam and Pap test. This may be done every 3 years starting at age 54. Starting at age 32, this may be done every 5 years if you have a Pap test in combination with an HPV test. Bone density scan. This is done to screen for osteoporosis. You may have this scan if you are at high risk for  osteoporosis. Discuss your test results, treatment options, and if necessary, the need for more tests with your health care  provider. Vaccines  Your health care provider may recommend certain vaccines, such as: Influenza vaccine. This is recommended every year. Tetanus, diphtheria, and acellular pertussis (Tdap, Td) vaccine. You may need a Td booster every 10 years. Zoster vaccine. You may need this after age 65. Pneumococcal 13-valent conjugate (PCV13) vaccine. You may need this if you have certain conditions and were not previously vaccinated. Pneumococcal polysaccharide (PPSV23) vaccine. You may need one or two doses if you smoke cigarettes or if you have certain conditions. Talk to your health care provider about which screenings and vaccines you need and how often you need them. This information is not intended to replace advice given to you by your health care provider. Make sure you discuss any questions you have with your health care provider. Document Released: 11/29/2015 Document Revised: 07/22/2016 Document Reviewed: 09/03/2015 Elsevier Interactive Patient Education  2017 [employer] Prevention in the Home Falls can cause injuries. They can happen to people of all ages. There are many things you can do to make your home safe and to help prevent falls. What can I do on the outside of my home? Regularly fix the edges of walkways and driveways and fix any cracks. Remove anything that might make you trip as you walk through a door, such as a raised step or threshold. Trim any bushes or trees on the path to your home. Use bright outdoor lighting. Clear any walking paths of anything that might make someone trip, such as rocks or tools. Regularly check to see if handrails are loose or broken. Make sure that both sides of any steps have handrails. Any raised decks and porches should have guardrails on the edges. Have any leaves, snow, or ice cleared regularly. Use sand or salt on walking paths during winter. Clean up any spills in your garage right away. This includes oil or grease spills. What  can I do in the bathroom? Use night lights. Install grab bars by the toilet and in the tub and shower. Do not use towel bars as grab bars. Use non-skid mats or decals in the tub or shower. If you need to sit down in the shower, use a plastic, non-slip stool. Keep the floor dry. Clean up any water that spills on the floor as soon as it happens. Remove soap buildup in the tub or shower regularly. Attach bath mats securely with double-sided non-slip rug tape. Do not have throw rugs and other things on the floor that can make you trip. What can I do in the bedroom? Use night lights. Make sure that you have a light by your bed that is easy to reach. Do not use any sheets or blankets that are too big for your bed. They should not hang down onto the floor. Have a firm chair that has side arms. You can use this for support while you get dressed. Do not have throw rugs and other things on the floor that can make you trip. What can I do in the kitchen? Clean up any spills right away. Avoid walking on wet floors. Keep items that you use a lot in easy-to-reach places. If you need to reach something above you, use a strong step stool that has a grab bar. Keep electrical cords out of the way. Do not use floor polish or wax that makes floors slippery. If you must use  wax, use non-skid floor wax. Do not have throw rugs and other things on the floor that can make you trip. What can I do with my stairs? Do not leave any items on the stairs. Make sure that there are handrails on both sides of the stairs and use them. Fix handrails that are broken or loose. Make sure that handrails are as long as the stairways. Check any carpeting to make sure that it is firmly attached to the stairs. Fix any carpet that is loose or worn. Avoid having throw rugs at the top or bottom of the stairs. If you do have throw rugs, attach them to the floor with carpet tape. Make sure that you have a light switch at the top of the  stairs and the bottom of the stairs. If you do not have them, ask someone to add them for you. What else can I do to help prevent falls? Wear shoes that: Do not have high heels. Have rubber bottoms. Are comfortable and fit you well. Are closed at the toe. Do not wear sandals. If you use a stepladder: Make sure that it is fully opened. Do not climb a closed stepladder. Make sure that both sides of the stepladder are locked into place. Ask someone to hold it for you, if possible. Clearly mark and make sure that you can see: Any grab bars or handrails. First and last steps. Where the edge of each step is. Use tools that help you move around (mobility aids) if they are needed. These include: Canes. Walkers. Scooters. Crutches. Turn on the lights when you go into a dark area. Replace any light bulbs as soon as they burn out. Set up your furniture so you have a clear path. Avoid moving your furniture around. If any of your floors are uneven, fix them. If there are any pets around you, be aware of where they are. Review your medicines with your doctor. Some medicines can make you feel dizzy. This can increase your chance of falling. Ask your doctor what other things that you can do to help prevent falls. This information is not intended to replace advice given to you by your health care provider. Make sure you discuss any questions you have with your health care provider. Document Released: 08/29/2009 Document Revised: 04/09/2016 Document Reviewed: 12/07/2014 Elsevier Interactive Patient Education  2017 [employer].

## 2021-05-07 ENCOUNTER — Ambulatory Visit: Payer: Medicare Other | Admitting: Internal Medicine

## 2021-06-05 ENCOUNTER — Other Ambulatory Visit: Payer: Self-pay | Admitting: Nurse Practitioner

## 2021-06-05 DIAGNOSIS — Z8709 Personal history of other diseases of the respiratory system: Secondary | ICD-10-CM

## 2021-07-02 ENCOUNTER — Encounter: Payer: Self-pay | Admitting: Nurse Practitioner

## 2021-07-03 ENCOUNTER — Encounter: Payer: Self-pay | Admitting: Nurse Practitioner

## 2021-07-03 ENCOUNTER — Telehealth (INDEPENDENT_AMBULATORY_CARE_PROVIDER_SITE_OTHER): Payer: Medicare Other | Admitting: Nurse Practitioner

## 2021-07-03 VITALS — Ht 63.0 in | Wt 197.0 lb

## 2021-07-03 DIAGNOSIS — R059 Cough, unspecified: Secondary | ICD-10-CM

## 2021-07-03 DIAGNOSIS — U071 COVID-19: Secondary | ICD-10-CM

## 2021-07-03 MED ORDER — GUAIFENESIN-DM 100-10 MG/5ML PO SYRP: 5.0000 mL | ORAL_SOLUTION | ORAL | 0 refills | Status: DC | PRN

## 2021-07-03 MED ORDER — NIRMATRELVIR/RITONAVIR (PAXLOVID)TABLET: 3.0000 | ORAL_TABLET | Freq: Two times a day (BID) | ORAL | 0 refills | Status: AC

## 2021-07-03 MED ORDER — BENZONATATE 100 MG PO CAPS: 100.0000 mg | ORAL_CAPSULE | Freq: Three times a day (TID) | ORAL | 1 refills | Status: DC | PRN

## 2021-07-03 NOTE — Patient Instructions (Signed)
COVID-19: Quarantine and Isolation Quarantine If you were exposed Quarantine and stay away from others when you have been in close contact with someone whohas COVID-19. Isolate If you are sick or test positive Isolate when you are sick or when you have COVID-19, even if you don't have symptoms. When to stay home Calculating quarantine The date of your exposure is considered day 0. Day 1 is the first full day after your last contact with a person who has had COVID-19. Stay home and away from other people for at least 5 days. Learn why CDC updated guidance for the general public. IF YOU were exposed to COVID-19 and are NOT up-to-date IF YOU were exposed to COVID-19 and are NOT on COVID-19 vaccinations Quarantine for at least 5 days Stay home Stay home and quarantine for at least 5 full days. Wear a well-fitted mask if you must be around others in your home. Do not travel. Get tested Even if you don't develop symptoms, get tested at least 5 days after you last had close contact with someone with COVID-19. After quarantine Watch for symptoms Watch for symptoms until 10 days after you last had close contact with someone with COVID-19. Avoid travel It is best to avoid travel until a full 10 days after you last had close contact with someone with COVID-19. If you develop symptoms Isolate immediately and get tested. Continue to stay home until you know the results. Wear a well-fitted mask around others. Take precautions until day 10 Wear a mask Wear a well-fitted mask for 10 full days any time you are around others inside your home or in public. Do not go to places where you are unable to wear a mask. If you must travel during days 6-10, take precautions. Avoid being around people who are at high risk IF YOU were exposed to COVID-19 and are up-to-date IF YOU were exposed to COVID-19 and are on COVID-19 vaccinations No quarantine You do not need to stay home unless you develop  symptoms. Get tested Even if you don't develop symptoms, get tested at least 5 days after you last had close contact with someone with COVID-19. Watch for symptoms Watch for symptoms until 10 days after you last had close contact with someone with COVID-19. If you develop symptoms Isolate immediately and get tested. Continue to stay home until you know the results. Wear a well-fitted mask around others. Take precautions until day 10 Wear a mask Wear a well-fitted mask for 10 full days any time you are around others inside your home or in public. Do not go to places where you are unable to wear a mask. Take precautions if traveling Avoid being around people who are at high risk IF YOU were exposed to COVID-19 and had confirmed COVID-19 within the past 90 days (you tested positive using a viral test) No quarantine You do not need to stay home unless you develop symptoms. Watch for symptoms Watch for symptoms until 10 days after you last had close contact with someone with COVID-19. If you develop symptoms Isolate immediately and get tested. Continue to stay home until you know the results. Wear a well-fitted mask around others. Take precautions until day 10 Wear a mask Wear a well-fitted mask for 10 full days any time you are around others inside your home or in public. Do not go to places where you are unable to wear a mask. Take precautions if traveling Avoid being around people who are at high risk Calculating isolation  Day 0 is your first day of symptoms or a positive viral test. Day 1 is the first full day after your symptoms developed or your test specimen was collected. If you have COVID-19 or have symptoms, isolate for at least 5 days. IF YOU tested positive for COVID-19 or have symptoms, regardless of vaccination status Stay home for at least 5 days Stay home for 5 days and isolate from others in your home. Wear a well-fitted mask if you must be around others in your home. Do not  travel. Ending isolation if you had symptoms End isolation after 5 full days if you are fever-free for 24 hours (without the use of fever-reducing medication) and your symptoms are improving. Ending isolation if you did NOT have symptoms End isolation after at least 5 full days after your positive test. If you were severely ill with COVID-19 or are immunocompromised You should isolate for at least 10 days. Consult your doctor before ending isolation. Take precautions until day 10 Wear a mask Wear a well-fitted mask for 10 full days any time you are around others inside your home or in public. Do not go to places where you are unable to wear a mask. Do not travel Do not travel until a full 10 days after your symptoms started or the date your positive test was taken if you had no symptoms. Avoid being around people who are at high risk Definitions Exposure Contact with someone infected with SARS-CoV-2, the virus that causes COVID-19,in a way that increases the likelihood of getting infected with the virus. Close contact A close contact is someone who was less than 6 feet away from an infected person (laboratory-confirmed or a clinical diagnosis) for a cumulative total of 15 minutes or more over a 24-hour period. For example, three individual 5-minute exposures for a total of 15 minutes. People who are exposed to someone with COVID-19 after they completed at least 5 days of isolation are notconsidered close contacts. Julio Sicks is a strategy used to prevent transmission of COVID-19 by keeping people who have been in close contact with someone with COVID-19 apart from others. Who does not need to quarantine? If you had close contact with someone with COVID-19 and you are in one of the following groups, you do not need to quarantine. You are up to date with your COVID-19 vaccines. You had confirmed COVID-19 within the last 90 days (meaning you tested positive using a viral test). You  should wear a well-fitting mask around others for 10 days from the date of your last close contact with someone with COVID-19 (the date of last close contact is considered day 0). Get tested at least 5 days after you last had close contact with someone with COVID-19. If you test positive or develop COVID-19 symptoms, isolate from other people and follow recommendations in the Isolation section below. If you tested positive for COVID-19 with a viral test within the previous 90 days and subsequently recovered and remain without COVID-19 symptoms, you do not need to quarantine or get tested after close contact. You should wear a well-fitting mask around others for 10 days from the date of your last close contact withsomeone with COVID-19 (the date of last close contact is considered day 0). Who should quarantine? If you come into close contact with someone with COVID-19, you should quarantine if you are not up to date on COVID-19 vaccines. This includes people who are not vaccinated. What to do for quarantine Stay home and away  from other people for at least 5 days (day 0 through day 5) after your last contact with a person who has COVID-19. The date of your exposure is considered day 0. Wear a well-fitting mask when around others at home, if possible. For 10 days after your last close contact with someone with COVID-19, watch for fever (100.66F or greater), cough, shortness of breath, or other COVID-19 symptoms. If you develop symptoms, get tested immediately and isolate until you receive your test results. If you test positive, follow isolation recommendations. If you do not develop symptoms, get tested at least 5 days after you last had close contact with someone with COVID-19. If you test negative, you can leave your home, but continue to wear a well-fitting mask when around others at home and in public until 10 days after your last close contact with someone with COVID-19. If you test positive, you should  isolate for at least 5 days from the date of your positive test (if you do not have symptoms). If you do develop COVID-19 symptoms, isolate for at least 5 days from the date your symptoms began (the date the symptoms started is day 0). Follow recommendations in the isolation section below. If you are unable to get a test 5 days after last close contact with someone with COVID-19, you can leave your home after day 5 if you have been without COVID-19 symptoms throughout the 5-day period. Wear a well-fitting mask for 10 days after your date of last close contact when around others at home and in public. Avoid people who are immunocompromised or at high risk for severe disease, and nursing homes and other high-risk settings, until after at least 10 days. If possible, stay away from people you live with, especially people who are at higher risk for getting very sick from COVID-19, as well as others outside your home throughout the full 10 days after your last close contact with someone with COVID-19. If you are unable to quarantine, you should wear a well-fitting mask for 10 days when around others at home and in public. If you are unable to wear a mask when around others, you should continue to quarantine for 10 days. Avoid people who are immunocompromised or at high risk for severe disease, and nursing homes and other high-risk settings, until after at least 10 days. See additional information about travel. Do not go to places where you are unable to wear a mask, such as restaurants and some gyms, and avoid eating around others at home and at work until after 10 days after your last close contact with someone with COVID-19. After quarantine Watch for symptoms until 10 days after your last close contact with someone with COVID-19. If you have symptoms, isolate immediately and get tested. Quarantine in high-risk congregate settings In certain congregate settings that have high risk of secondary transmission  (such as Systems analyst and detention facilities, homeless shelters, or cruise ships), CDC recommends a 10-day quarantine for residents, regardless of vaccination and booster status. During periods of critical staffing shortages, facilities may consider shortening the quarantine period for staff to ensure continuity of operations. Decisions to shorten quarantine in these settings should be made in consultation with state, local, tribal, or territorial health departments and should take into consideration the context and characteristics of the facility. CDC's setting-specific guidance provides additional recommendations for these settings. Isolation Isolation is used to separate people with confirmed or suspected COVID-19 from those without COVID-19. People who are in isolation should stay  home until it's safe for them to be around others. At home, anyone sick or infected should separate from others, or wear a well-fitting mask when they need to be around others. People in isolation should stay in a specific "sick room" or area and use a separate bathroom if available. Everyone who has presumed or confirmed COVID-19 should stay home and isolate from other people for at least 5 full days (day 0 is the first day of symptoms or the date of the day of the positive viral test for asymptomatic persons). They should wear a mask when around others at home and in public for an additional 5 days. People who are confirmed to have COVID-19 or are showing symptoms of COVID-19 need to isolate regardless of their vaccination status. This includes: People who have a positive viral test for COVID-19, regardless of whether or not they have symptoms. People with symptoms of COVID-19, including people who are awaiting test results or have not been tested. People with symptoms should isolate even if they do not know if they have been in close contact with someone with COVID-19. What to do for isolation Monitor your symptoms. If you  have an emergency warning sign (including trouble breathing), seek emergency medical care immediately. Stay in a separate room from other household members, if possible. Use a separate bathroom, if possible. Take steps to improve ventilation at home, if possible. Avoid contact with other members of the household and pets. Don't share personal household items, like cups, towels, and utensils. Wear a well-fitting mask when you need to be around other people. Learn more about what to do if you are sick and how to notify your contacts. Ending isolation for people who had COVID-19 and had symptoms If you had COVID-19 and had symptoms, isolate for at least 5 days. To calculate your 5-day isolation period, day 0 is your first day of symptoms. Day 1 is the first full day after your symptoms developed. You can leave isolation after 5 full days. You can end isolation after 5 full days if you are fever-free for 24 hours without the use of fever-reducing medication and your other symptoms have improved (Loss of taste and smell may persist for weeks or months after recovery and need not delay the end of isolation). You should continue to wear a well-fitting mask around others at home and in public for 5 additional days (day 6 through day 10) after the end of your 5-day isolation period. If you are unable to wear a mask when around others, you should continue to isolate for a full 10 days. Avoid people who are immunocompromised or at high risk for severe disease, and nursing homes and other high-risk settings, until after at least 10 days. If you continue to have fever or your other symptoms have not improved after 5 days of isolation, you should wait to end your isolation until you are fever-free for 24 hours without the use of fever-reducing medication and your other symptoms have improved. Continue to wear a well-fitting mask. Contact your healthcare provider if you have questions. See additional information about  travel. Do not go to places where you are unable to wear a mask, such as restaurants and some gyms, and avoid eating around others at home and at work until a full 10 days after your first day of symptoms. If an individual has access to a test and wants to test, the best approach is to use an antigen test1 towards the end of  the 5-day isolation period. Collect the test sample only if you are fever-free for 24 hours without the use of fever-reducing medication and your other symptoms have improved (loss of taste and smell may persist for weeks or months after recovery and need not delay the end of isolation). If your test result is positive, you should continue to isolate until day 10. If your test result is negative, you can end isolation, but continue to wear a well-fitting mask around others at home and in public until day 10. Follow additional recommendations for masking and avoiding travel as described above. 1As noted in the labeling for authorized over-the counter antigen tests: Negative results should be treated as presumptive. Negative results do not rule out SARS-CoV-2 infection and should not be used as the sole basis for treatment or patient management decisions, including infection control decisions. To improve results, antigen tests should be used twice over a three-day period with at least 24 hours and no more than 48 hours between tests. Note that these recommendations on ending isolation do not apply to people with moderate or severe COVID-19 or with weakened immune systems (immunocompromised). See section below for recommendations for when toend isolation for these groups. Ending isolation for people who tested positive for COVID-19 but had no symptoms If you test positive for COVID-19 and never develop symptoms, isolate for at least 5 days. Day 0 is the day of your positive viral test (based on the date you were tested) and day 1 is the first full day after the specimen was collected for your  positive test. You can leave isolation after 5 full days. If you continue to have no symptoms, you can end isolation after at least 5 days. You should continue to wear a well-fitting mask around others at home and in public until day 10 (day 6 through day 10). If you are unable to wear a mask when around others, you should continue to isolate for 10 days. Avoid people who are immunocompromised or at high risk for severe disease, and nursing homes and other high-risk settings, until after at least 10 days. If you develop symptoms after testing positive, your 5-day isolation period should start over. Day 0 is your first day of symptoms. Follow the recommendations above for ending isolation for people who had COVID-19 and had symptoms. See additional information about travel. Do not go to places where you are unable to wear a mask, such as restaurants and some gyms, and avoid eating around others at home and at work until 10 days after the day of your positive test. If an individual has access to a test and wants to test, the best approach is to use an antigen test1 towards the end of the 5-day isolation period. If your test result is positive, you should continue to isolate until day 10. If your test result is negative, you can end isolation, but continue to wear a well-fitting mask around others at home and in public until day 10. Follow additionalrecommendations for masking and avoiding travel as described above. 1As noted in the labeling for authorized over-the counter antigen tests: Negative results should be treated as presumptive. Negative results do not rule out SARS-CoV-2 infection and should not be used as the sole basis for treatment or patient management decisions, including infection control decisions. To improve results, antigen tests should be used twice over a three-day period with at least 24 hours and no more than 48 hours between tests. Ending isolation for people who were  severely ill with  COVID-19 or have a weakened immune system (immunocompromised) People who are severely ill with COVID-19 (including those who were hospitalized or required intensive care or ventilation support) and people with compromised immune systems might need to isolate at home longer. They may also require testing with a viral test to determine when they can be around others. CDC recommends an isolation period of at least 10 and up to 20 days for people who were severely ill with COVID-19 and for people with weakened immune systems. Consult with your healthcare provider about when you can resume being aroundother people. People who are immunocompromised should talk to their healthcare provider about the potential for reduced immune responses to COVID-19 vaccines and the need to continue to follow current prevention measures (including wearing a well-fitting mask, staying 6 feet apart from others they don't live with, and avoiding crowds and poorly ventilated indoor spaces) to protect themselves against COVID-19 until advised otherwise by their healthcare provider. Close contacts of immunocompromised people--including household members--should also be encouraged to receive all recommended COVID-19 vaccine doses to help protect these people. Isolation in high-risk congregate settings In certain high-risk congregate settings that have high risk of secondary transmission and where it is not feasible to cohort people (such as Systems analyst and detention facilities, homeless shelters, and cruise ships), CDC recommends a 10-day isolation period for residents. During periods of critical staffing shortages, facilities may consider shortening the isolation period for staff to ensure continuity of operations. Decisions to shorten isolation in these settings should be made in consultation with state, local, tribal, or territorial health departments and should take into consideration the context and characteristics of the facility.  CDC's setting-specific guidance provides additional recommendations for these settings. This CDC guidance is meant to supplement--not replace--any federal, state, local,territorial, or tribal health and safety laws, rules, and regulations. Recommendations for specific settings These recommendations do not apply to healthcare professionals. For guidance specific to these settings, see Healthcare professionals: Interim Guidance for Optician, dispensing with SARS-CoV-2 Infection or Exposure to SARS-CoV-2 Patients, residents, and visitors to healthcare settings: Interim Infection Prevention and Control Recommendations for Healthcare Personnel During the [place] 2019 (COVID-19) Pandemic Additional setting-specific guidance and recommendations are available. These recommendations on quarantine and isolation do apply to [facility] settings. Additional guidance is available here: Overview of COVID-19 Quarantine for K-12 Schools Travelers: Travel information and recommendations Congregate facilities and other settings: [employer] for community, work, and school settings Ongoing COVID-19 exposure FAQs I live with someone with COVID-19, but I cannot be separated from them. How do we manage quarantine in this situation? It is very important for people with COVID-19 to remain apart from other people, if possible, even if they are living together. If separation of the person with COVID-19 from others that they live with is not possible, the other people that they live with will have ongoing exposure, meaning they will be repeatedly exposed until that person is no longer able to spread the virus to other people. In this situation, there are precautions you can take to limit the spread of COVID-19: The person with COVID-19 and everyone they live with should wear a well-fitting mask inside the home. If possible, one person should care for the person with COVID-19 to limit the number of people  who are in close contact with the infected person. Take steps to protect yourself and others to reduce transmission in the home: Quarantine if you are not up to date with your COVID-19  vaccines. Isolate if you are sick or tested positive for COVID-19, even if you don't have symptoms. Learn more about the public health recommendations for testing, mask use and quarantine of close contacts, like yourself, who have ongoing exposure. These recommendations differ depending on your vaccination status. What should I do if I have ongoing exposure to COVID-19 from someone I live with? Recommendations for this situation depend on your vaccination status: If you are not up to date on COVID-19 vaccines and have ongoing exposure to COVID-19, you should: Begin quarantine immediately and continue to quarantine throughout the isolation period of the person with COVID-19. Continue to quarantine for an additional 5 days starting the day after the end of isolation for the person with COVID-19. Get tested at least 5 days after the end of isolation of the infected person that lives with them. If you test negative, you can leave the home but should continue to wear a well-fitting mask when around others at home and in public until 10 days after the end of isolation for the person with COVID-19. Isolate immediately if you develop symptoms of COVID-19 or test positive. If you are up to date with COVID-19 vaccines and have ongoing exposure to COVID-19, you should: Get tested at least 5 days after your first exposure. A person with COVID-19 is considered infectious starting 2 days before they develop symptoms, or 2 days before the date of their positive test if they do not have symptoms. Get tested again at least 5 days after the end of isolation for the person with COVID-19. Wear a well-fitting mask when you are around the person with COVID-19, and do this throughout their isolation period. Wear a well-fitting mask around  others for 10 days after the infected person's isolation period ends. Isolate immediately if you develop symptoms of COVID-19 or test positive. What should I do if multiple people I live with test positive for COVID-19 at different times? Recommendations for this situation depend on your vaccination status: If you are not up to date with your COVID-19 vaccines, you should: Quarantine throughout the isolation period of any infected person that you live with. Continue to quarantine until 5 days after the end of isolation date for the most recently infected person that lives with you. For example, if the last day of isolation of the person most recently infected with COVID-19 was June 30, the new 5-day quarantine period starts on July 1. Get tested at least 5 days after the end of isolation for the most recently infected person that lives with you. Wear a well-fitting mask when you are around any person with COVID-19 while that person is in isolation. Wear a well-fitting mask when you are around other people until 10 days after your last close contact. Isolate immediately if you develop symptoms of COVID-19 or test positive. If you are up to date with COVID-19 your vaccines , you should: Get tested at least 5 days after your first exposure. A person with COVID-19 is considered infectious starting 2 days before they developed symptoms, or 2 days before the date of their positive test if they do not have symptoms. Get tested again at least 5 days after the end of isolation for the most recently infected person that lives with you. Wear a well-fitting mask when you are around any person with COVID-19 while that person is in isolation. Wear a well-fitting mask around others for 10 days after the end of isolation for the most recently infected person that  lives with you. For example, if the last day of isolation for the person most recently infected with COVID-19 was June 30, the new 10-day period to wear a  well-fitting mask indoors in public starts on July 1. Isolate immediately if you develop symptoms of COVID-19 or test positive. I had COVID-19 and completed isolation. Do I have to quarantine or get tested if someone I live with gets COVID-19 shortly after I completed isolation? No. If you recently completed isolation and someone that lives with you tests positive for the virus that causes COVID-19 shortly after the end of your isolation period, you do not have to quarantine or get tested as long as you do not develop new symptoms. Once all of the people that live together have completed isolation or quarantine, refer to the guidance below for new exposures to COVID-19. If you had COVID-19 in the previous 90 days and then came into close contact with someone with COVID-19, you do not have to quarantine or get tested if you do not have symptoms. But you should: Wear a well-fitting mask indoors in public for 10 days after exposure. Monitor for COVID-19 symptoms and isolate immediately if symptoms develop. Consult with a healthcare provider for testing recommendations if new symptoms develop. If more than 90 days have passed since your recovery from infection, follow CDC's recommendations for close contacts. These recommendations will differ depending on your vaccination status. 12/12/2020 Content source: [facility] ([facility]), Division of Viral Diseases This information is not intended to replace advice given to you by your health care provider. Make sure you discuss any questions you have with your healthcare provider. Document Revised: 12/20/2020 Document Reviewed: 12/20/2020 Elsevier Patient Education  [place].

## 2021-07-03 NOTE — Progress Notes (Signed)
Virtual Visit via 100% video    This visit type was conducted due to national recommendations for restrictions regarding the COVID-19 Pandemic (e.g. social distancing) in an effort to limit this patient's exposure and mitigate transmission in our community.  Due to her co-morbid illnesses, this patient is at least at moderate risk for complications without adequate follow up.  This format is felt to be most appropriate for this patient at this time.  All issues noted in this document were discussed and addressed.  A limited physical exam was performed with this format.    This visit type was conducted due to national recommendations for restrictions regarding the COVID-19 Pandemic (e.g. social distancing) in an effort to limit this patient's exposure and mitigate transmission in our community.  Patients identity confirmed using two different identifiers.  This format is felt to be most appropriate for this patient at this time.  All issues noted in this document were discussed and addressed.  No physical exam was performed (except for noted visual exam findings with Video Visits).    Date:  07/03/2021   ID:  Virginia [name], DOB [birth year], MRN [id number]  Patient Location:  Home   Provider location:   Office   Chief Complaint:  Positive for Covid   History of Present Illness:    Virginia [name] is a 60 y.o. female who presents via video conferencing for a telehealth visit today.    The patient does have symptoms concerning for COVID-19 infection (fever, chills, cough, or new shortness of breath).   She tested positive yesterday.The symptoms started Tuesday. And she tested positive on Wed. HA, chest burning, cold. She has been taking tylenol. No SOB.  She is vaccinated and has one booster. She is coughing.  She has COPD,     Past Medical History:  Diagnosis Date   Anxiety    Arthritis    Chest pain, unspecified    Dermatitis    HLD (hyperlipidemia)    no current med.   Insomnia     Loss of hair    Low back pain    Major depressive disorder, single episode, mild (HCC)    Personal history of colonic polyps    Shoulder impingement 06/2012   left   Vitamin D deficiency    Wears dentures    upper denture   Wears partial dentures    lower partial   Past Surgical History:  Procedure Laterality Date   ABDOMINAL HYSTERECTOMY  1990s   partial   APPENDECTOMY  06/08/2006   CARPAL TUNNEL RELEASE  01/14/2005   left; with left cubital tunnel release   EXPLORATORY LAPAROTOMY WITH ABDOMINAL MASS EXCISION  06/08/2006   resection right ovarian mass, partial omentectomy   FULKERSON SLIDE  10/19/2007   right knee   KNEE ARTHROSCOPY  05/28/2010   left   KNEE ARTHROSCOPY     right   SALPINGOOPHORECTOMY  06/08/2006   bilat.   TOTAL KNEE ARTHROPLASTY  05/04/2011   left     Current Meds  Medication Sig   albuterol (VENTOLIN HFA) 108 (90 Base) MCG/ACT inhaler INHALE 2 PUFFS INTO THE LUNGS EVERY 6 HOURS AS NEEDED FOR WHEEZING OR SHORTNESS OF BREATH   atorvastatin (LIPITOR) 40 MG tablet TAKE 1 TABLET BY MOUTH MONDAY THRU FRIDAY   FLUoxetine (PROZAC) 20 MG tablet Take 20 mg by mouth daily.   Vitamin D, Ergocalciferol, (DRISDOL) 1.25 MG (50000 UNIT) CAPS capsule Take 1 capsule (50,000 Units total) by mouth every 7 (  seven) days.   zolpidem (AMBIEN) 10 MG tablet Take 10 mg by mouth at bedtime.     Allergies:   Doxycycline, Penicillins, and Rosuvastatin   Social History   Tobacco Use   Smoking status: Former    Packs/day: 2.00    Years: 40.00    Pack years: 80.00    Types: Cigarettes   Smokeless tobacco: Never   Tobacco comments:    quit smoking 02/2012  Vaping Use   Vaping Use: Never used  Substance Use Topics   Alcohol use: Yes    Comment: moderate, socially   Drug use: No     Family Hx: The patient's family history includes COPD in her mother and another family member; Depression in her mother; Diabetes in her mother and another family member; Heart attack in an other  family member; Heart disease in her mother; Hypertension in an other family member; Hypothyroidism in her father.  ROS:   Please see the history of present illness.    Review of Systems  Constitutional:  Positive for malaise/fatigue. Negative for chills and fever.  HENT:  Positive for congestion.   Respiratory:  Positive for cough and sputum production. Negative for shortness of breath.   Cardiovascular:  Negative for chest pain.  Gastrointestinal:  Negative for constipation and diarrhea.  Neurological:  Positive for headaches.   All other systems reviewed and are negative.   Labs/Other Tests and Data Reviewed:    Recent Labs: 11/06/2020: ALT 41; BUN 17; Creatinine, Ser 0.82; Hemoglobin 13.9; Platelets 218; Potassium 4.1; Sodium 139   Recent Lipid Panel Lab Results  Component Value Date/Time   CHOL 246 (H) 11/06/2020 03:09 PM   TRIG 170 (H) 11/06/2020 03:09 PM   HDL 72 11/06/2020 03:09 PM   CHOLHDL 3.4 11/06/2020 03:09 PM   LDLCALC 144 (H) 11/06/2020 03:09 PM    Wt Readings from Last 3 Encounters:  07/03/21 197 lb (89.4 kg)  05/01/21 197 lb (89.4 kg)  11/13/20 196 lb 6.4 oz (89.1 kg)     Exam:    Vital Signs:  Ht '5\' 3"'$  (1.6 m)   Wt 197 lb (89.4 kg)   BMI 34.90 kg/m     Physical Exam Vitals and nursing note reviewed.  HENT:     Head: Normocephalic and atraumatic.  Pulmonary:     Effort: Pulmonary effort is normal.  Neurological:     Mental Status: She is alert and oriented to person, place, and time.  Psychiatric:        Mood and Affect: Affect normal.    ASSESSMENT & PLAN:    1. COVID-19 - nirmatrelvir/ritonavir EUA (PAXLOVID) 20 x 150 MG & 10 x '100MG'$  TABS; Take 3 tablets by mouth 2 (two) times daily for 5 days. Patient GFR is 79. Take nirmatrelvir (150 mg) two tablets twice daily for 5 days and ritonavir (100 mg) one tablet twice daily for 5 days.  Dispense: 30 tablet; Refill: 0 -Went over SE of taking Paxlovid  -Discussed with patient possible drug  interaction with Paxlovid. Instructed her not to take Lipitor 12 hour prior to initiating therapy and during therapy of paxlovid. Patient verbalized understanding.  -Side effects and appropriate use of all the medication(s) were discussed with the patient today. Patient advised to use the medication(s) as directed by their healthcare provider. The patient was encouraged to read, review, and understand all associated package inserts and contact our office with any questions or concerns. The patient accepts the risks of the treatment plan  and had an opportunity to ask questions.    2. Cough - benzonatate (TESSALON PERLES) 100 MG capsule; Take 1 capsule (100 mg total) by mouth 3 (three) times daily as needed for cough.  Dispense: 30 capsule; Refill: 1 - guaiFENesin-dextromethorphan (ROBITUSSIN DM) 100-10 MG/5ML syrup; Take 5 mLs by mouth every 4 (four) hours as needed for cough.  Dispense: 118 mL; Refill: 0  The patient was encouraged to call or send a message through [place] for any questions or concerns.   Follow up: if symptoms persist or do not get better.   Side effects and appropriate use of all the medication(s) were discussed with the patient today. Patient advised to use the medication(s) as directed by their healthcare provider. The patient was encouraged to read, review, and understand all associated package inserts and contact our office with any questions or concerns. The patient accepts the risks of the treatment plan and had an opportunity to ask questions.   Advised patient to take Vitamin C, D, Zinc. Keep yourself hydrated with a lot of water and rest. Take Delsym for cough and Mucinex. Take Tylenol or pain reliever every 4-6 hours as needed for pain/fever/body ache. Educated patient if symptoms get worse or if she experiences any SOB/chest pain or pain in her legs to seek immediate emergency care. Continue to monitor your pulse oxygen. Call us if you have any questions. Quarantine for 5  days if tested positive and no symptoms or 10 days if tested positive and have symptoms. Wear a mask around other people.   COVID-19 Education: The signs and symptoms of COVID-19 were discussed with the patient and how to seek care for testing (follow up with PCP or arrange E-visit).  The importance of social distancing was discussed today.  Patient Risk:   After full review of this patients clinical status, I feel that they are at least moderate risk at this time.  Time:   Today, I have spent 15 minutes/ seconds with the patient with telehealth technology discussing above diagnoses.     Medication Adjustments/Labs and Tests Ordered: Current medicines are reviewed at length with the patient today.  Concerns regarding medicines are outlined above.   Tests Ordered: No orders of the defined types were placed in this encounter.   Medication Changes: No orders of the defined types were placed in this encounter.    Signed, Bary Castilla, NP

## 2021-07-17 ENCOUNTER — Other Ambulatory Visit: Payer: Self-pay | Admitting: Internal Medicine

## 2021-12-18 ENCOUNTER — Other Ambulatory Visit: Payer: Self-pay | Admitting: Nurse Practitioner

## 2022-01-29 ENCOUNTER — Other Ambulatory Visit: Payer: Self-pay

## 2022-01-29 ENCOUNTER — Ambulatory Visit (INDEPENDENT_AMBULATORY_CARE_PROVIDER_SITE_OTHER): Payer: Medicare Other | Admitting: Nurse Practitioner

## 2022-01-29 ENCOUNTER — Encounter: Payer: Self-pay | Admitting: Nurse Practitioner

## 2022-01-29 VITALS — BP 112/70 | HR 77 | Temp 97.9°F | Ht 63.2 in | Wt 197.6 lb

## 2022-01-29 DIAGNOSIS — Z6834 Body mass index (BMI) 34.0-34.9, adult: Secondary | ICD-10-CM

## 2022-01-29 DIAGNOSIS — Z1231 Encounter for screening mammogram for malignant neoplasm of breast: Secondary | ICD-10-CM

## 2022-01-29 DIAGNOSIS — E6609 Other obesity due to excess calories: Secondary | ICD-10-CM | POA: Diagnosis not present

## 2022-01-29 DIAGNOSIS — Z23 Encounter for immunization: Secondary | ICD-10-CM

## 2022-01-29 DIAGNOSIS — R21 Rash and other nonspecific skin eruption: Secondary | ICD-10-CM | POA: Diagnosis not present

## 2022-01-29 DIAGNOSIS — E7800 Pure hypercholesterolemia, unspecified: Secondary | ICD-10-CM

## 2022-01-29 DIAGNOSIS — E559 Vitamin D deficiency, unspecified: Secondary | ICD-10-CM | POA: Diagnosis not present

## 2022-01-29 DIAGNOSIS — Z139 Encounter for screening, unspecified: Secondary | ICD-10-CM | POA: Diagnosis not present

## 2022-01-29 DIAGNOSIS — E66811 Obesity, class 1: Secondary | ICD-10-CM

## 2022-01-29 MED ORDER — CLINDAMYCIN PHOSPHATE 1 % EX FOAM: 1.0000 "application " | Freq: Two times a day (BID) | CUTANEOUS | 1 refills | Status: DC

## 2022-01-29 NOTE — Progress Notes (Signed)
?Virginia [name],acting as a Education administrator for Virginia Brine, FNP.,have documented all relevant documentation on the behalf of Virginia Brine, FNP,as directed by  Virginia Brine, FNP while in the presence of Virginia [name], [place].  ? ?This visit occurred during the SARS-CoV-2 public health emergency.  Safety protocols were in place, including screening questions prior to the visit, additional usage of staff PPE, and extensive cleaning of exam room while observing appropriate contact time as indicated for disinfecting solutions. ? ?Subjective:  ?  ? Patient ID: Virginia [name] , female    DOB: 1961-10-19 , 61 y.o.   MRN: [id number] ? ? ?Chief Complaint  ?Patient presents with  ? Rash  ? ? ?HPI ? ?Patient presents today for a rash on her face.  ? ?Wt Readings from Last 3 Encounters: ?07/03/21 : 197 lb (89.4 kg) ?05/01/21 : 197 lb (89.4 kg) ?11/13/20 : 196 lb 6.4 oz (89.1 kg) ?  ? ?Rash ?This is a new problem. The current episode started more than 1 month ago (2 months ago). The problem is unchanged. The affected locations include the face. The rash is characterized by redness, burning, itchiness and dryness (will whelp up). She was exposed to nothing. Pertinent negatives include no congestion, cough, facial edema or fatigue. Past treatments include anti-itch cream and topical steroids. The treatment provided no relief. There is no history of asthma.   ? ?Past Medical History:  ?Diagnosis Date  ? Anxiety   ? Arthritis   ? Chest pain, unspecified   ? Dermatitis   ? HLD (hyperlipidemia)   ? no current med.  ? Insomnia   ? Loss of hair   ? Low back pain   ? Major depressive disorder, single episode, mild ([place])   ? Personal history of colonic polyps   ? Shoulder impingement 06/2012  ? left  ? Vitamin D deficiency   ? Wears dentures   ? upper denture  ? Wears partial dentures   ? lower partial  ?  ? ?Family History  ?Problem Relation Age of Onset  ? Heart attack Other   ? Hypertension Other   ? Diabetes Other   ? COPD Other   ? COPD  Mother   ? Heart disease Mother   ? Diabetes Mother   ? Depression Mother   ? Hypothyroidism Father   ? ? ? ?Current Outpatient Medications:  ?  albuterol (VENTOLIN HFA) 108 (90 Base) MCG/ACT inhaler, INHALE 2 PUFFS INTO THE LUNGS EVERY 6 HOURS AS NEEDED FOR WHEEZING OR SHORTNESS OF BREATH, Disp: 6.7 g, Rfl: 1 ?  atorvastatin (LIPITOR) 40 MG tablet, TAKE 1 TABLET BY MOUTH MONDAY THROUGH FRIDAY, Disp: 75 tablet, Rfl: 1 ?  Clindamycin Phosphate foam, Apply 1 application. topically 2 (two) times daily., Disp: 50 g, Rfl: 1 ?  FLUoxetine (PROZAC) 20 MG tablet, Take 20 mg by mouth daily., Disp: , Rfl:  ?  Vitamin D, Ergocalciferol, (DRISDOL) 1.25 MG (50000 UNIT) CAPS capsule, TAKE ONE CAPSULE BY MOUTH EVERY 7 DAYS, Disp: 15 capsule, Rfl: 3 ?  zolpidem (AMBIEN) 10 MG tablet, Take 10 mg by mouth at bedtime., Disp: , Rfl:   ? ?Allergies  ?Allergen Reactions  ? Doxycycline Itching  ? Penicillins Hives and Swelling  ?  SWELLING OF TONGUE ?SWELLING OF JOINTS  ? Rosuvastatin Other (See Comments)  ?  MUSCLE ACHES  ?  ? ?Review of Systems  ?Constitutional:  Negative for fatigue.  ?HENT:  Negative for congestion.   ?Respiratory:  Negative for  cough.   ?Skin:  Positive for rash.   ? ?Today's Vitals  ? 01/29/22 1035  ?BP: 112/70  ?Pulse: 77  ?Temp: 97.9 ?F (36.6 ?C)  ?Weight: 197 lb 9.6 oz (89.6 kg)  ?Height: 5' 3.2" (1.605 m)  ?PainSc: 0-No pain  ? ?Body mass index is 34.78 kg/m?.  ? ?Objective:  ?Physical Exam ?Vitals reviewed.  ?Constitutional:   ?   General: She is not in acute distress. ?   Appearance: Normal appearance. She is well-developed. She is obese.  ?HENT:  ?   Head: Normocephalic and atraumatic.  ?Eyes:  ?   Pupils: Pupils are equal, round, and reactive to light.  ?Cardiovascular:  ?   Rate and Rhythm: Normal rate and regular rhythm.  ?   Pulses: Normal pulses.  ?   Heart sounds: Normal heart sounds. No murmur heard. ?Pulmonary:  ?   Effort: Pulmonary effort is normal. No respiratory distress.  ?   Breath sounds:  Normal breath sounds. No wheezing.  ?Musculoskeletal:     ?   General: Normal range of motion.  ?Skin: ?   General: Skin is warm and dry.  ?   Capillary Refill: Capillary refill takes less than 2 seconds.  ?   Findings: Rash (reddened scaly rash to cheeks, no drainage) present.  ?Neurological:  ?   General: No focal deficit present.  ?   Mental Status: She is alert and oriented to person, place, and time.  ?   Cranial Nerves: No cranial nerve deficit.  ?   Motor: No weakness.  ?Psychiatric:     ?   Mood and Affect: Mood normal.     ?   Behavior: Behavior normal.     ?   Thought Content: Thought content normal.     ?   Judgment: Judgment normal.  ?  ? ?   ?Assessment And Plan:  ?   ?1. Rash and nonspecific skin eruption ?Comments: Reddened scaly rash to face eczema vs psoriasis ?- CBC with Differential/Platelet ?- Clindamycin Phosphate foam; Apply 1 application. topically 2 (two) times daily.  Dispense: 50 g; Refill: 1 ? ?2. Class 1 obesity due to excess calories with serious comorbidity and body mass index (BMI) of 34.0 to 34.9 in adult ?Chronic ?Discussed healthy diet and regular exercise options  ?Encouraged to exercise at least 150 minutes per week with 2 days of strength training as tolereated ? ?3. Vitamin D deficiency ?Will check vitamin D level and supplement as needed.    ?Also encouraged to spend 15 minutes in the sun daily.  ?- VITAMIN D 25 Hydroxy (Vit-D Deficiency, Fractures) ? ?4. Pure hypercholesterolemia ?Continue statin, tolerating well.  ?- CMP14+EGFR ?- Lipid panel ? ?5. Encounter for screening ?Will check to see if she has antibodies before administering the shingrix vaccine ?- Varicella zoster antibody, IgG ? ?6. Encounter for screening mammogram for breast cancer ?Pt instructed on Self Breast Exam.According to ACOG guidelines Women aged 60 and older are recommended to get an annual mammogram. Form completed and given to patient contact the [facility] for appointment scheduling.  ?Pt  encouraged to get annual mammogram ?- MM Digital Screening; Future ? ?7. Immunization due ?Will give tetanus vaccine today while in office. Refer to order management. TDAP will be administered to adults 69-39 years old every 10 years. ?TransRx done in office ?- Tdap vaccine greater than or equal to 7yo IM ?  ? ? ?Patient was given opportunity to ask questions. Patient verbalized understanding  of the plan and was able to repeat key elements of the plan. All questions were answered to their satisfaction.  ?Virginia Brine, FNP  ? ?I, Virginia Brine, FNP, have reviewed all documentation for this visit. The documentation on 01/29/22 for the exam, diagnosis, procedures, and orders are all accurate and complete.  ? ?IF YOU HAVE BEEN REFERRED TO A SPECIALIST, IT MAY TAKE 1-2 WEEKS TO SCHEDULE/PROCESS THE REFERRAL. IF YOU HAVE NOT HEARD FROM US/SPECIALIST IN TWO WEEKS, PLEASE GIVE [place] A CALL AT [phone].  ? ?THE PATIENT IS ENCOURAGED TO PRACTICE SOCIAL DISTANCING DUE TO THE COVID-19 PANDEMIC.   ?

## 2022-01-30 LAB — CMP14+EGFR
ALT: 33 IU/L — ABNORMAL HIGH (ref 0–32)
AST: 25 IU/L (ref 0–40)
Albumin/Globulin Ratio: 2.6 — ABNORMAL HIGH (ref 1.2–2.2)
Albumin: 4.7 g/dL (ref 3.8–4.9)
Alkaline Phosphatase: 112 IU/L (ref 44–121)
BUN/Creatinine Ratio: 19 (ref 12–28)
BUN: 18 mg/dL (ref 8–27)
Bilirubin Total: 0.3 mg/dL (ref 0.0–1.2)
CO2: 21 mmol/L (ref 20–29)
Calcium: 9.3 mg/dL (ref 8.7–10.3)
Chloride: 105 mmol/L (ref 96–106)
Creatinine, Ser: 0.95 mg/dL (ref 0.57–1.00)
Globulin, Total: 1.8 g/dL (ref 1.5–4.5)
Glucose: 98 mg/dL (ref 70–99)
Potassium: 4.4 mmol/L (ref 3.5–5.2)
Sodium: 141 mmol/L (ref 134–144)
Total Protein: 6.5 g/dL (ref 6.0–8.5)
eGFR: 69 mL/min/{1.73_m2} (ref >59)

## 2022-01-30 LAB — CBC WITH DIFFERENTIAL/PLATELET
Basophils Absolute: 0.1 10*3/uL (ref 0.0–0.2)
Basos: 2 %
EOS (ABSOLUTE): 0.1 10*3/uL (ref 0.0–0.4)
Eos: 2 %
Hematocrit: 40.9 % (ref 34.0–46.6)
Hemoglobin: 14.1 g/dL (ref 11.1–15.9)
Immature Grans (Abs): 0 10*3/uL (ref 0.0–0.1)
Immature Granulocytes: 0 %
Lymphocytes Absolute: 1.5 10*3/uL (ref 0.7–3.1)
Lymphs: 25 %
MCH: 31.8 pg (ref 26.6–33.0)
MCHC: 34.5 g/dL (ref 31.5–35.7)
MCV: 92 fL (ref 79–97)
Monocytes Absolute: 0.5 10*3/uL (ref 0.1–0.9)
Monocytes: 9 %
Neutrophils Absolute: 3.9 10*3/uL (ref 1.4–7.0)
Neutrophils: 62 %
Platelets: 246 10*3/uL (ref 150–450)
RBC: 4.44 x10E6/uL (ref 3.77–5.28)
RDW: 12.1 % (ref 11.7–15.4)
WBC: 6.1 10*3/uL (ref 3.4–10.8)

## 2022-01-30 LAB — LIPID PANEL
Chol/HDL Ratio: 3.3 ratio (ref 0.0–4.4)
Cholesterol, Total: 196 mg/dL (ref 100–199)
HDL: 59 mg/dL (ref >39)
LDL Chol Calc (NIH): 119 mg/dL — ABNORMAL HIGH (ref 0–99)
Triglycerides: 99 mg/dL (ref 0–149)
VLDL Cholesterol Cal: 18 mg/dL (ref 5–40)

## 2022-01-30 LAB — VARICELLA ZOSTER ANTIBODY, IGG: Varicella zoster IgG: 1177 index (ref >165)

## 2022-01-30 LAB — VITAMIN D 25 HYDROXY (VIT D DEFICIENCY, FRACTURES): Vit D, 25-Hydroxy: 41.5 ng/mL (ref 30.0–100.0)

## 2022-02-03 ENCOUNTER — Telehealth: Payer: Self-pay

## 2022-02-03 NOTE — Telephone Encounter (Signed)
PA sent to plan

## 2022-02-04 ENCOUNTER — Ambulatory Visit: Payer: Medicare Other | Admitting: Nurse Practitioner

## 2022-02-09 ENCOUNTER — Encounter: Payer: Self-pay | Admitting: Nurse Practitioner

## 2022-02-25 ENCOUNTER — Ambulatory Visit
Admission: RE | Admit: 2022-02-25 | Discharge: 2022-02-25 | Disposition: A | Discharge status: Home / Self Care | Payer: Medicare Other | Source: Ambulatory Visit | Attending: Nurse Practitioner | Admitting: Nurse Practitioner

## 2022-02-25 DIAGNOSIS — Z1231 Encounter for screening mammogram for malignant neoplasm of breast: Secondary | ICD-10-CM

## 2022-03-09 ENCOUNTER — Other Ambulatory Visit: Payer: Self-pay | Admitting: Internal Medicine

## 2022-06-02 DIAGNOSIS — M50322 Other cervical disc degeneration at C5-C6 level: Secondary | ICD-10-CM | POA: Diagnosis not present

## 2022-06-02 DIAGNOSIS — M5136 Other intervertebral disc degeneration, lumbar region: Secondary | ICD-10-CM | POA: Diagnosis not present

## 2022-06-02 DIAGNOSIS — M9901 Segmental and somatic dysfunction of cervical region: Secondary | ICD-10-CM | POA: Diagnosis not present

## 2022-06-02 DIAGNOSIS — M9903 Segmental and somatic dysfunction of lumbar region: Secondary | ICD-10-CM | POA: Diagnosis not present

## 2022-06-11 ENCOUNTER — Ambulatory Visit: Payer: Medicare Other | Admitting: Internal Medicine

## 2022-06-11 ENCOUNTER — Ambulatory Visit: Payer: Medicare Other

## 2022-06-17 ENCOUNTER — Telehealth: Payer: Self-pay | Admitting: Internal Medicine

## 2022-06-17 NOTE — Telephone Encounter (Signed)
Left message for patient to call back and schedule Medicare Annual Wellness Visit (AWV) either virtually or in office.  Left both my jabber number [phone] and office number    Last AWV ;05/01/21  please schedule at anytime with [facility]

## 2022-06-22 DIAGNOSIS — M5136 Other intervertebral disc degeneration, lumbar region: Secondary | ICD-10-CM | POA: Diagnosis not present

## 2022-06-22 DIAGNOSIS — M9901 Segmental and somatic dysfunction of cervical region: Secondary | ICD-10-CM | POA: Diagnosis not present

## 2022-06-22 DIAGNOSIS — M9903 Segmental and somatic dysfunction of lumbar region: Secondary | ICD-10-CM | POA: Diagnosis not present

## 2022-06-22 DIAGNOSIS — M50322 Other cervical disc degeneration at C5-C6 level: Secondary | ICD-10-CM | POA: Diagnosis not present

## 2022-07-08 ENCOUNTER — Ambulatory Visit (INDEPENDENT_AMBULATORY_CARE_PROVIDER_SITE_OTHER): Payer: Medicare Other

## 2022-07-08 VITALS — Ht 63.0 in | Wt 190.0 lb

## 2022-07-08 DIAGNOSIS — Z87891 Personal history of nicotine dependence: Secondary | ICD-10-CM | POA: Diagnosis not present

## 2022-07-08 DIAGNOSIS — Z122 Encounter for screening for malignant neoplasm of respiratory organs: Secondary | ICD-10-CM

## 2022-07-08 DIAGNOSIS — Z0000 Encounter for general adult medical examination without abnormal findings: Secondary | ICD-10-CM | POA: Diagnosis not present

## 2022-07-08 NOTE — Progress Notes (Signed)
I connected with Awa Bachicha today by telephone and verified that I am speaking with the correct person using two identifiers. Location patient: home Location provider: work Persons participating in the virtual visit: Daianna Vasques, Glenna Durand LPN.   I discussed the limitations, risks, security and privacy concerns of performing an evaluation and management service by telephone and the availability of in person appointments. I also discussed with the patient that there may be a patient responsible charge related to this service. The patient expressed understanding and verbally consented to this telephonic visit.    Interactive audio and video telecommunications were attempted between this provider and patient, however failed, due to patient having technical difficulties OR patient did not have access to video capability.  We continued and completed visit with audio only.     Vital signs may be patient reported or missing.  Subjective:   Virginia [name] is a 61 y.o. female who presents for Medicare Annual (Subsequent) preventive examination.  Review of Systems     Cardiac Risk Factors include: obesity (BMI >30kg/m2)     Objective:    Today's Vitals   07/08/22 1156  Weight: 190 lb (86.2 kg)  Height: '5\' 3"'$  (1.6 m)   Body mass index is 33.66 kg/m.     07/08/2022   12:00 PM 05/01/2021    2:06 PM 04/24/2020    2:06 PM 04/12/2019    3:39 PM 07/14/2012    4:30 PM  Advanced Directives  Does Patient Have a Medical Advance Directive? Yes Yes Yes Yes Patient does not have advance directive;Patient would not like information  Type of Scientist, forensic Power of [place];Living will [employer];Living will [place];Living will [place];Living will   Copy of [place] in Chart? No - copy requested No - copy requested No - copy requested No - copy requested     Current Medications (verified) Outpatient  Encounter Medications as of 07/08/2022  Medication Sig   albuterol (VENTOLIN HFA) 108 (90 Base) MCG/ACT inhaler INHALE 2 PUFFS INTO THE LUNGS EVERY 6 HOURS AS NEEDED FOR WHEEZING OR SHORTNESS OF BREATH   atorvastatin (LIPITOR) 40 MG tablet TAKE 1 TABLET BY MOUTH MONDAY THROUGH FRIDAY   desvenlafaxine (PRISTIQ) 100 MG 24 hr tablet Take 1 tablet by mouth daily.   modafinil (PROVIGIL) 100 MG tablet Take 50-100 mg by mouth daily.   Vitamin D, Ergocalciferol, (DRISDOL) 1.25 MG (50000 UNIT) CAPS capsule TAKE ONE CAPSULE BY MOUTH EVERY 7 DAYS   zolpidem (AMBIEN) 10 MG tablet Take 10 mg by mouth at bedtime.   Clindamycin Phosphate foam Apply 1 application. topically 2 (two) times daily. (Patient not taking: Reported on 07/08/2022)   FLUoxetine (PROZAC) 20 MG tablet Take 20 mg by mouth daily.   No facility-administered encounter medications on file as of 07/08/2022.    Allergies (verified) Doxycycline, Penicillins, and Rosuvastatin   History: Past Medical History:  Diagnosis Date   Anxiety    Arthritis    Chest pain, unspecified    Dermatitis    HLD (hyperlipidemia)    no current med.   Insomnia    Loss of hair    Low back pain    Major depressive disorder, single episode, mild (HCC)    Personal history of colonic polyps    Shoulder impingement 06/2012   left   Vitamin D deficiency    Wears dentures    upper denture   Wears partial dentures  lower partial   Past Surgical History:  Procedure Laterality Date   ABDOMINAL HYSTERECTOMY  1990s   partial   APPENDECTOMY  06/08/2006   CARPAL TUNNEL RELEASE  01/14/2005   left; with left cubital tunnel release   EXPLORATORY LAPAROTOMY WITH ABDOMINAL MASS EXCISION  06/08/2006   resection right ovarian mass, partial omentectomy   FULKERSON SLIDE  10/19/2007   right knee   KNEE ARTHROSCOPY  05/28/2010   left   KNEE ARTHROSCOPY     right   SALPINGOOPHORECTOMY  06/08/2006   bilat.   TOTAL KNEE ARTHROPLASTY  05/04/2011   left   Family History   Problem Relation Age of Onset   Heart attack Other    Hypertension Other    Diabetes Other    COPD Other    COPD Mother    Heart disease Mother    Diabetes Mother    Depression Mother    Hypothyroidism Father    Social History   Socioeconomic History   Marital status: Single    Spouse name: Not on file   Number of children: Not on file   Years of education: Not on file   Highest education level: Not on file  Occupational History   Occupation: disability  Tobacco Use   Smoking status: Former    Packs/day: 2.00    Years: 40.00    Total pack years: 80.00    Types: Cigarettes   Smokeless tobacco: Never   Tobacco comments:    quit smoking 02/2012  Vaping Use   Vaping Use: Never used  Substance and Sexual Activity   Alcohol use: Yes    Comment: moderate, socially   Drug use: No   Sexual activity: Not Currently  Other Topics Concern   Not on file  Social History Narrative   Not on file   Social Determinants of Health   Financial Resource Strain: Low Risk  (07/08/2022)   Overall Financial Resource Strain (CARDIA)    Difficulty of Paying Living Expenses: Not hard at all  Food Insecurity: No Food Insecurity (07/08/2022)   Hunger Vital Sign    Worried About Running Out of Food in the Last Year: Never true    [place] in the Last Year: Never true  Transportation Needs: No Transportation Needs (07/08/2022)   PRAPARE - Hydrologist (Medical): No    Lack of Transportation (Non-Medical): No  Physical Activity: Insufficiently Active (07/08/2022)   Exercise Vital Sign    Days of Exercise per Week: 1 day    Minutes of Exercise per Session: 20 min  Stress: No Stress Concern Present (07/08/2022)   [place]    Feeling of Stress : Not at all  Social Connections: Not on file    Tobacco Counseling Counseling given: Not Answered Tobacco comments: quit smoking  02/2012   Clinical Intake:  Pre-visit preparation completed: Yes  Pain : No/denies pain     Nutritional Status: BMI > 30  Obese Nutritional Risks: None Diabetes: No  How often do you need to have someone help you when you read instructions, pamphlets, or other written materials from your doctor or pharmacy?: 1 - Never What is the last grade level you completed in school?: some college  Diabetic? no  Interpreter Needed?: No  Information entered by :: NAllen LPN   Activities of Daily Living    07/08/2022   12:02 PM 07/06/2022    2:31 PM  In your present state of health, do you have any difficulty performing the following activities:  Hearing? 0 0  Vision? 0 0  Difficulty concentrating or making decisions? 0 0  Walking or climbing stairs? 0 0  Dressing or bathing? 0 0  Doing errands, shopping? 0 0  Preparing Food and eating ? N N  Using the Toilet? N N  In the past six months, have you accidently leaked urine? Y Y  Comment with sneezing   Do you have problems with loss of bowel control? N N  Managing your Medications? N N  Managing your Finances? N N  Housekeeping or managing your Housekeeping? N N    Patient Care Team: Glendale Chard, MD as PCP - General (Internal Medicine)  Indicate any recent Medical Services you may have received from other than [facility] providers in the past year (date may be approximate).     Assessment:   This is a routine wellness examination for Sarit.  Hearing/Vision screen Vision Screening - Comments:: Regular eye exams, Dr. Prudencio Burly  Dietary issues and exercise activities discussed: Current Exercise Habits: Home exercise routine, Time (Minutes): 20, Frequency (Times/Week): 1, Weekly Exercise (Minutes/Week): 20   Goals Addressed             This Visit's Progress    Patient Stated       07/08/2022, wants to lose weight       Depression Screen    07/08/2022   12:01 PM 05/01/2021    2:07 PM 11/06/2020    2:04 PM 04/24/2020    2:07  PM 11/06/2019    4:26 PM 04/12/2019    3:40 PM 12/05/2018    3:18 PM  PHQ 2/9 Scores  PHQ - 2 Score 1 4 0 2 0 0 5  PHQ- 9 Score  6 0 '2  1 17    '$ Fall Risk    07/08/2022   12:01 PM 07/06/2022    2:31 PM 05/01/2021    2:07 PM 04/24/2020    2:07 PM 04/12/2019    3:40 PM  Fall Risk   Falls in the past year? 0 0 0 0 0  Number falls in past yr: 0 0     Injury with Fall? 0 0     Risk for fall due to : Medication side effect  Medication side effect Medication side effect   Follow up Falls evaluation completed;Education provided;Falls prevention discussed  Falls evaluation completed;Education provided;Falls prevention discussed Falls evaluation completed;Education provided;Falls prevention discussed Falls prevention discussed    FALL RISK PREVENTION PERTAINING TO THE HOME:  Any stairs in or around the home? No  If so, are there any without handrails? N/a Home free of loose throw rugs in walkways, pet beds, electrical cords, etc? Yes  Adequate lighting in your home to reduce risk of falls? Yes   ASSISTIVE DEVICES UTILIZED TO PREVENT FALLS:  Life alert? No  Use of a cane, walker or w/c? No  Grab bars in the bathroom? Yes  Shower chair or bench in shower? No  Elevated toilet seat or a handicapped toilet? Yes   TIMED UP AND GO:  Was the test performed? No .      Cognitive Function:        07/08/2022   12:03 PM 05/01/2021    2:10 PM 04/24/2020    2:09 PM 04/12/2019    3:42 PM  6CIT Screen  What Year? 0 points 0 points 0 points 0 points  What month? 0 points  0 points 0 points 0 points  What time? 0 points 0 points 0 points 0 points  Count back from 20 0 points 0 points 0 points 0 points  Months in reverse 0 points 0 points 0 points 0 points  Repeat phrase 4 points 2 points 4 points 0 points  Total Score 4 points 2 points 4 points 0 points    Immunizations Immunization History  Administered Date(s) Administered   DTaP 08/12/2010   Influenza Inj Mdck Quad With Preservative  10/25/2017   Influenza,inj,Quad PF,6+ Mos 11/06/2019, 11/06/2020   Influenza-Unspecified 11/24/2021   PFIZER(Purple Top)SARS-COV-2 Vaccination 02/06/2020, 02/27/2020, 09/15/2020, 11/24/2021   Tdap 01/29/2022   Zoster Recombinat (Shingrix) 02/12/2022    TDAP status: Up to date  Flu Vaccine status: Due, Education has been provided regarding the importance of this vaccine. Advised may receive this vaccine at local pharmacy or Health Dept. Aware to provide a copy of the vaccination record if obtained from local pharmacy or Health Dept. Verbalized acceptance and understanding.  Pneumococcal vaccine status: Up to date  Covid-19 vaccine status: Completed vaccines  Qualifies for Shingles Vaccine? Yes   Zostavax completed No   Shingrix Completed?: No.    Education has been provided regarding the importance of this vaccine. Patient has been advised to call insurance company to determine out of pocket expense if they have not yet received this vaccine. Advised may also receive vaccine at local pharmacy or Health Dept. Verbalized acceptance and understanding.  Screening Tests Health Maintenance  Topic Date Due   COVID-19 Vaccine (5 - Pfizer risk series) 01/19/2022   Zoster Vaccines- Shingrix (2 of 2) 04/09/2022   INFLUENZA VACCINE  06/16/2022   COLONOSCOPY (Pts 45-83yr Insurance coverage will need to be confirmed)  12/27/2023   MAMMOGRAM  02/26/2024   TETANUS/TDAP  01/30/2032   Hepatitis C Screening  Completed   HIV Screening  Completed   HPV VACCINES  Aged Out   PAP SMEAR-Modifier  Discontinued    Health Maintenance  Health Maintenance Due  Topic Date Due   COVID-19 Vaccine (5 - Pfizer risk series) 01/19/2022   Zoster Vaccines- Shingrix (2 of 2) 04/09/2022   INFLUENZA VACCINE  06/16/2022    Colorectal cancer screening: Type of screening: Colonoscopy. Completed 12/26/2020. Repeat every 3 years  Mammogram status: Completed 02/25/2022. Repeat every year  Bone Density status:  n/a  Lung Cancer Screening: (Low Dose CT Chest recommended if Age 60-80years, 30 pack-year currently smoking OR have quit w/in 15years.) does qualify.   Lung Cancer Screening Referral: referral placed today  Additional Screening:  Hepatitis C Screening: does qualify; Completed 12/05/2018  Vision Screening: Recommended annual ophthalmology exams for early detection of glaucoma and other disorders of the eye. Is the patient up to date with their annual eye exam?  Yes  Who is the provider or what is the name of the office in which the patient attends annual eye exams? Dr. LPrudencio BurlyIf pt is not established with a provider, would they like to be referred to a provider to establish care? No .   Dental Screening: Recommended annual dental exams for proper oral hygiene  Community Resource Referral / Chronic Care Management: CRR required this visit?  No   CCM required this visit?  No      Plan:     I have personally reviewed and noted the following in the patient's chart:   Medical and social history Use of alcohol, tobacco or illicit drugs  Current medications and supplements including opioid  prescriptions. Patient is not currently taking opioid prescriptions. Functional ability and status Nutritional status Physical activity Advanced directives List of other physicians Hospitalizations, surgeries, and ER visits in previous 12 months Vitals Screenings to include cognitive, depression, and falls Referrals and appointments  In addition, I have reviewed and discussed with patient certain preventive protocols, quality metrics, and best practice recommendations. A written personalized care plan for preventive services as well as general preventive health recommendations were provided to patient.     Kellie Simmering, LPN   [id number]   Nurse Notes: none  Due to this being a virtual visit, the after visit summary with patients personalized plan was offered to patient via mail or  my-chart.  Patient would like to access on my-chart

## 2022-07-08 NOTE — Patient Instructions (Signed)
Virginia [name] , Thank you for taking time to come for your Medicare Wellness Visit. I appreciate your ongoing commitment to your health goals. Please review the following plan we discussed and let me know if I can assist you in the future.   Screening recommendations/referrals: Colonoscopy: completed 12/26/2020, due 12/27/2023 Mammogram: completed 02/25/2022, due 02/27/2023 Bone Density: n/a Recommended yearly ophthalmology/optometry visit for glaucoma screening and checkup Recommended yearly dental visit for hygiene and checkup  Vaccinations: Influenza vaccine: due Pneumococcal vaccine: n/a Tdap vaccine: completed 01/29/2022, due 01/30/2032 Shingles vaccine: needs second dose  Covid-19:  11/24/2021, 09/15/2020, 02/27/2020, 02/06/2020  Advanced directives: Please bring a copy of your POA (Power of Attorney) and/or Living Will to your next appointment.   Conditions/risks identified: none  Next appointment: Follow up in one year for your annual wellness visit.   Preventive Care 40-64 Years, Female Preventive care refers to lifestyle choices and visits with your health care provider that can promote health and wellness. What does preventive care include? A yearly physical exam. This is also called an annual well check. Dental exams once or twice a year. Routine eye exams. Ask your health care provider how often you should have your eyes checked. Personal lifestyle choices, including: Daily care of your teeth and gums. Regular physical activity. Eating a healthy diet. Avoiding tobacco and drug use. Limiting alcohol use. Practicing safe sex. Taking low-dose aspirin daily starting at age 40. Taking vitamin and mineral supplements as recommended by your health care provider. What happens during an annual well check? The services and screenings done by your health care provider during your annual well check will depend on your age, overall health, lifestyle risk factors, and family history of  disease. Counseling  Your health care provider may ask you questions about your: Alcohol use. Tobacco use. Drug use. Emotional well-being. Home and relationship well-being. Sexual activity. Eating habits. Work and work Statistician. Method of birth control. Menstrual cycle. Pregnancy history. Screening  You may have the following tests or measurements: Height, weight, and BMI. Blood pressure. Lipid and cholesterol levels. These may be checked every 5 years, or more frequently if you are over 62 years old. Skin check. Lung cancer screening. You may have this screening every year starting at age 66 if you have a 30-pack-year history of smoking and currently smoke or have quit within the past 15 years. Fecal occult blood test (FOBT) of the stool. You may have this test every year starting at age 58. Flexible sigmoidoscopy or colonoscopy. You may have a sigmoidoscopy every 5 years or a colonoscopy every 10 years starting at age 32. Hepatitis C blood test. Hepatitis B blood test. Sexually transmitted disease (STD) testing. Diabetes screening. This is done by checking your blood sugar (glucose) after you have not eaten for a while (fasting). You may have this done every 1-3 years. Mammogram. This may be done every 1-2 years. Talk to your health care provider about when you should start having regular mammograms. This may depend on whether you have a family history of breast cancer. BRCA-related cancer screening. This may be done if you have a family history of breast, ovarian, tubal, or peritoneal cancers. Pelvic exam and Pap test. This may be done every 3 years starting at age 1. Starting at age 75, this may be done every 5 years if you have a Pap test in combination with an HPV test. Bone density scan. This is done to screen for osteoporosis. You may have this scan if you are  at high risk for osteoporosis. Discuss your test results, treatment options, and if necessary, the need for more  tests with your health care provider. Vaccines  Your health care provider may recommend certain vaccines, such as: Influenza vaccine. This is recommended every year. Tetanus, diphtheria, and acellular pertussis (Tdap, Td) vaccine. You may need a Td booster every 10 years. Zoster vaccine. You may need this after age 41. Pneumococcal 13-valent conjugate (PCV13) vaccine. You may need this if you have certain conditions and were not previously vaccinated. Pneumococcal polysaccharide (PPSV23) vaccine. You may need one or two doses if you smoke cigarettes or if you have certain conditions. Talk to your health care provider about which screenings and vaccines you need and how often you need them. This information is not intended to replace advice given to you by your health care provider. Make sure you discuss any questions you have with your health care provider. Document Released: 11/29/2015 Document Revised: 07/22/2016 Document Reviewed: 09/03/2015 Elsevier Interactive Patient Education  2017 [place] Prevention in the Home Falls can cause injuries. They can happen to people of all ages. There are many things you can do to make your home safe and to help prevent falls. What can I do on the outside of my home? Regularly fix the edges of walkways and driveways and fix any cracks. Remove anything that might make you trip as you walk through a door, such as a raised step or threshold. Trim any bushes or trees on the path to your home. Use bright outdoor lighting. Clear any walking paths of anything that might make someone trip, such as rocks or tools. Regularly check to see if handrails are loose or broken. Make sure that both sides of any steps have handrails. Any raised decks and porches should have guardrails on the edges. Have any leaves, snow, or ice cleared regularly. Use sand or salt on walking paths during winter. Clean up any spills in your garage right away. This includes  oil or grease spills. What can I do in the bathroom? Use night lights. Install grab bars by the toilet and in the tub and shower. Do not use towel bars as grab bars. Use non-skid mats or decals in the tub or shower. If you need to sit down in the shower, use a plastic, non-slip stool. Keep the floor dry. Clean up any water that spills on the floor as soon as it happens. Remove soap buildup in the tub or shower regularly. Attach bath mats securely with double-sided non-slip rug tape. Do not have throw rugs and other things on the floor that can make you trip. What can I do in the bedroom? Use night lights. Make sure that you have a light by your bed that is easy to reach. Do not use any sheets or blankets that are too big for your bed. They should not hang down onto the floor. Have a firm chair that has side arms. You can use this for support while you get dressed. Do not have throw rugs and other things on the floor that can make you trip. What can I do in the kitchen? Clean up any spills right away. Avoid walking on wet floors. Keep items that you use a lot in easy-to-reach places. If you need to reach something above you, use a strong step stool that has a grab bar. Keep electrical cords out of the way. Do not use floor polish or wax that makes floors slippery.  If you must use wax, use non-skid floor wax. Do not have throw rugs and other things on the floor that can make you trip. What can I do with my stairs? Do not leave any items on the stairs. Make sure that there are handrails on both sides of the stairs and use them. Fix handrails that are broken or loose. Make sure that handrails are as long as the stairways. Check any carpeting to make sure that it is firmly attached to the stairs. Fix any carpet that is loose or worn. Avoid having throw rugs at the top or bottom of the stairs. If you do have throw rugs, attach them to the floor with carpet tape. Make sure that you have a light  switch at the top of the stairs and the bottom of the stairs. If you do not have them, ask someone to add them for you. What else can I do to help prevent falls? Wear shoes that: Do not have high heels. Have rubber bottoms. Are comfortable and fit you well. Are closed at the toe. Do not wear sandals. If you use a stepladder: Make sure that it is fully opened. Do not climb a closed stepladder. Make sure that both sides of the stepladder are locked into place. Ask someone to hold it for you, if possible. Clearly mark and make sure that you can see: Any grab bars or handrails. First and last steps. Where the edge of each step is. Use tools that help you move around (mobility aids) if they are needed. These include: Canes. Walkers. Scooters. Crutches. Turn on the lights when you go into a dark area. Replace any light bulbs as soon as they burn out. Set up your furniture so you have a clear path. Avoid moving your furniture around. If any of your floors are uneven, fix them. If there are any pets around you, be aware of where they are. Review your medicines with your doctor. Some medicines can make you feel dizzy. This can increase your chance of falling. Ask your doctor what other things that you can do to help prevent falls. This information is not intended to replace advice given to you by your health care provider. Make sure you discuss any questions you have with your health care provider. Document Released: 08/29/2009 Document Revised: 04/09/2016 Document Reviewed: 12/07/2014 Elsevier Interactive Patient Education  2017 [employer].

## 2022-07-15 DIAGNOSIS — L821 Other seborrheic keratosis: Secondary | ICD-10-CM | POA: Diagnosis not present

## 2022-07-15 DIAGNOSIS — L700 Acne vulgaris: Secondary | ICD-10-CM | POA: Diagnosis not present

## 2022-07-15 DIAGNOSIS — D225 Melanocytic nevi of trunk: Secondary | ICD-10-CM | POA: Diagnosis not present

## 2022-07-15 DIAGNOSIS — L814 Other melanin hyperpigmentation: Secondary | ICD-10-CM | POA: Diagnosis not present

## 2022-07-29 DIAGNOSIS — M9903 Segmental and somatic dysfunction of lumbar region: Secondary | ICD-10-CM | POA: Diagnosis not present

## 2022-07-29 DIAGNOSIS — M9902 Segmental and somatic dysfunction of thoracic region: Secondary | ICD-10-CM | POA: Diagnosis not present

## 2022-07-29 DIAGNOSIS — M5442 Lumbago with sciatica, left side: Secondary | ICD-10-CM | POA: Diagnosis not present

## 2022-07-29 DIAGNOSIS — M9905 Segmental and somatic dysfunction of pelvic region: Secondary | ICD-10-CM | POA: Diagnosis not present

## 2022-08-03 ENCOUNTER — Ambulatory Visit (INDEPENDENT_AMBULATORY_CARE_PROVIDER_SITE_OTHER): Payer: Medicare Other | Admitting: Psychology

## 2022-08-03 DIAGNOSIS — F324 Major depressive disorder, single episode, in partial remission: Secondary | ICD-10-CM
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
## 2022-08-05 DIAGNOSIS — M5386 Other specified dorsopathies, lumbar region: Secondary | ICD-10-CM | POA: Diagnosis not present

## 2022-08-05 DIAGNOSIS — M9905 Segmental and somatic dysfunction of pelvic region: Secondary | ICD-10-CM | POA: Diagnosis not present

## 2022-08-05 DIAGNOSIS — M9904 Segmental and somatic dysfunction of sacral region: Secondary | ICD-10-CM | POA: Diagnosis not present

## 2022-08-05 DIAGNOSIS — M9903 Segmental and somatic dysfunction of lumbar region: Secondary | ICD-10-CM | POA: Diagnosis not present

## 2022-08-10 ENCOUNTER — Ambulatory Visit: Payer: Medicare Other | Admitting: Psychology

## 2022-08-10 DIAGNOSIS — F324 Major depressive disorder, single episode, in partial remission: Secondary | ICD-10-CM

## 2022-08-10 NOTE — Progress Notes (Signed)
[facility] Counselor/Therapist Progress Note  Patient ID: Virginia [name], MRN: [id number]   Date: 08/10/22  Time Spent: 11: 35 am - 12:21 am : 75 Minutes  Treatment Type: Individual Therapy.  Reported Symptoms: depression and anxiety.   Mental Status Exam: Appearance:  Well Groomed     Behavior: Appropriate  Motor: Normal  Speech/Language:  Clear and Coherent  Affect: Flat  Mood: normal  Thought process: normal  Thought content:   WNL  Sensory/Perceptual disturbances:   WNL  Orientation: oriented to person, place, time/date, and situation  Attention: Good  Concentration: Good  Memory: WNL  Fund of knowledge:  Good  Insight:   Good  Judgment:  Good  Impulse Control: Good   Risk Assessment: Danger to Self:  No Self-injurious Behavior: No Danger to Others: No Duty to Warn:no Physical Aggression / Violence:No  Access to Firearms a concern: No  Gang Involvement:No   Subjective:   Virginia [name] participated from car, via video and consented to treatment. Therapist participated from home office. We met online due to [place] pandemic. Virginia [name] reviewed the events of the past week. We reviewed numerous treatment approaches including CBT, BA, Problem Solving, and Solution focused therapy. Psych-education regarding the Virginia [name]'s diagnosis of Major depressive disorder with single episode, in partial remission (Virginia [name]) was provided during the session. We discussed Virginia [name]'s goals treatment goals which include "why I let people push my buttons", "how to deal with people better", being more patient, how to communicate better, setting boundaries for self and others,, manage symptoms, processing past events, increasing confidence, defining healthy relationship, process relationship with father.  Virginia [name] provided verbal approval of the treatment plan. Therapist encouraged Virginia [name] to meet with a RD to address her goals.   Interventions: Psycho-education & Goal Setting.    Diagnosis:  Major depressive disorder with single episode, in partial remission [facility])   Treatment Plan:  Client Abilities/Strengths Virginia [name] is forthcoming and motivated for change.   Support System: Family and friends.   Client Treatment Preferences Outpatient therapy.   Client Statement of Needs Virginia [name] would like to "why I let people push my buttons", "how to deal with people better", being more patient, how to communicate better, setting boundaries for self and others, manage symptoms, processing past events, increasing confidence, defining healthy relationship, process relationship with father, improve self-care (healthful eating, exercise),    Treatment Level Weekly  Symptoms  GAD: Feeling nervious, difficulty managing worry, worrying about about different things, trouble relaxing, restlessness, irritability, feeling afraid something awful might happen.  (Status: maintained) Depression: Loss of interest, feeling down, fluctuating sleep, poor appetite, overeating, weight-gain, feeling bad about self, difficulty concentrating.           (Status: maintained)  Goals:   Virginia [name] experiences symptoms of depression and anxiety.    Target Date: 08/11/23 Frequency: Weekly  Progress: 0 Modality: individual    Therapist will provide referrals for additional resources as appropriate.  Therapist will provide psycho-education regarding Virginia [name]'s diagnosis and corresponding treatment approaches and interventions. Licensed Clinical Social Worker, [name], LCSW will support the patient's ability to achieve the goals identified. will employ CBT, BA, Problem-solving, Solution Focused, Mindfulness,  coping skills, & other evidenced-based practices will be used to promote progress towards healthy functioning to help manage decrease symptoms associated with her diagnosis.   Reduce overall level, frequency, and intensity of the feelings of depression, anxiety and panic evidenced by decreased overall  symptoms from 6 to 7 days/week to 0 to  1 days/week per client report for at least 3 consecutive months. Verbally express understanding of the relationship between feelings of depression, anxiety and their impact on thinking patterns and behaviors. Verbalize an understanding of the role that distorted thinking plays in creating fears, excessive worry, and ruminations.    Virginia [name] participated in the creation of the treatment plan)    Buena Irish, LCSW

## 2022-08-12 DIAGNOSIS — H52203 Unspecified astigmatism, bilateral: Secondary | ICD-10-CM | POA: Diagnosis not present

## 2022-08-12 DIAGNOSIS — F909 Attention-deficit hyperactivity disorder, unspecified type: Secondary | ICD-10-CM | POA: Insufficient documentation

## 2022-08-12 DIAGNOSIS — H02831 Dermatochalasis of right upper eyelid: Secondary | ICD-10-CM | POA: Diagnosis not present

## 2022-08-12 DIAGNOSIS — H02834 Dermatochalasis of left upper eyelid: Secondary | ICD-10-CM | POA: Diagnosis not present

## 2022-08-24 ENCOUNTER — Other Ambulatory Visit: Payer: Self-pay | Admitting: *Deleted

## 2022-08-24 DIAGNOSIS — Z122 Encounter for screening for malignant neoplasm of respiratory organs: Secondary | ICD-10-CM

## 2022-08-24 DIAGNOSIS — Z87891 Personal history of nicotine dependence: Secondary | ICD-10-CM

## 2022-08-25 ENCOUNTER — Encounter: Payer: Self-pay | Admitting: Nurse Practitioner

## 2022-08-25 ENCOUNTER — Ambulatory Visit (INDEPENDENT_AMBULATORY_CARE_PROVIDER_SITE_OTHER): Payer: Medicare Other | Admitting: Nurse Practitioner

## 2022-08-25 VITALS — BP 124/80 | HR 91 | Temp 97.7°F | Ht 63.0 in | Wt 199.4 lb

## 2022-08-25 DIAGNOSIS — E559 Vitamin D deficiency, unspecified: Secondary | ICD-10-CM | POA: Diagnosis not present

## 2022-08-25 DIAGNOSIS — F909 Attention-deficit hyperactivity disorder, unspecified type: Secondary | ICD-10-CM

## 2022-08-25 DIAGNOSIS — E7800 Pure hypercholesterolemia, unspecified: Secondary | ICD-10-CM | POA: Diagnosis not present

## 2022-08-25 DIAGNOSIS — Z79899 Other long term (current) drug therapy: Secondary | ICD-10-CM | POA: Diagnosis not present

## 2022-08-25 DIAGNOSIS — R5383 Other fatigue: Secondary | ICD-10-CM

## 2022-08-25 DIAGNOSIS — E6609 Other obesity due to excess calories: Secondary | ICD-10-CM

## 2022-08-25 DIAGNOSIS — Z6835 Body mass index (BMI) 35.0-35.9, adult: Secondary | ICD-10-CM

## 2022-08-25 NOTE — Patient Instructions (Signed)

## 2022-08-25 NOTE — Progress Notes (Signed)
I,Victoria T Hamilton,acting as a Education administrator for [name], FNP.,have documented all relevant documentation on the behalf of [name], FNP,as directed by  [name], FNP while in the presence of [name], [place].   Subjective:     Patient ID: Virginia [name] , female    DOB: 1961/07/15 , 61 y.o.   MRN: [id number]   Chief Complaint  Patient presents with   Weight Check    HPI  Patient presents today wanting to discuss her weight.  She feels her weight is stress related to living with her father who is 38 y/o.   She is walking a limited amount of time.   Breakfast - protein shake, lunch - sandwich, dinner - chicken and fish, vegetables (green beans). Snacks (none). She feels she has issue with carbs such as bread and potatoes.   She is sleeping 6-8 hours a night.   Her main concern, not being able to lose weight.  She adds, changing her diet & exercising.  She is interested in a weight loss medication Pt also reports seeing psychiatrist Dr. Erling Cruz. She was taking provigil for energy which worked for a little while. She has recently been started on ADHD.   Wt Readings from Last 3 Encounters: 08/25/22 : 199 lb 6.4 oz (90.4 kg) 07/08/22 : 190 lb (86.2 kg) 01/29/22 : 197 lb 9.6 oz (89.6 kg)     Past Medical History:  Diagnosis Date   Anxiety    Arthritis    Chest pain, unspecified    Dermatitis    HLD (hyperlipidemia)    no current med.   Insomnia    Loss of hair    Low back pain    Major depressive disorder, single episode, mild (HCC)    Personal history of colonic polyps    Shoulder impingement 06/2012   left   Vitamin D deficiency    Wears dentures    upper denture   Wears partial dentures    lower partial     Family History  Problem Relation Age of Onset   Heart attack Other    Hypertension Other    Diabetes Other    COPD Other    COPD Mother    Heart disease Mother    Diabetes Mother    Depression Mother    Hypothyroidism Father      Current  Outpatient Medications:    albuterol (VENTOLIN HFA) 108 (90 Base) MCG/ACT inhaler, INHALE 2 PUFFS INTO THE LUNGS EVERY 6 HOURS AS NEEDED FOR WHEEZING OR SHORTNESS OF BREATH, Disp: 6.7 g, Rfl: 1   atomoxetine (STRATTERA) 25 MG capsule, Take 50 capsules by mouth once., Disp: , Rfl:    atorvastatin (LIPITOR) 40 MG tablet, TAKE 1 TABLET BY MOUTH MONDAY THROUGH FRIDAY, Disp: 75 tablet, Rfl: 1   desvenlafaxine (PRISTIQ) 100 MG 24 hr tablet, Take 1 tablet by mouth daily., Disp: , Rfl:    Vitamin D, Ergocalciferol, (DRISDOL) 1.25 MG (50000 UNIT) CAPS capsule, TAKE ONE CAPSULE BY MOUTH EVERY 7 DAYS, Disp: 15 capsule, Rfl: 3   Clindamycin Phosphate foam, Apply 1 application. topically 2 (two) times daily. (Patient not taking: Reported on 07/08/2022), Disp: 50 g, Rfl: 1   FLUoxetine (PROZAC) 20 MG tablet, Take 20 mg by mouth daily., Disp: , Rfl:    modafinil (PROVIGIL) 100 MG tablet, Take 50-100 mg by mouth daily., Disp: , Rfl:    Allergies  Allergen Reactions   Doxycycline Itching   Penicillins Hives and Swelling  SWELLING OF TONGUE SWELLING OF JOINTS   Rosuvastatin Other (See Comments)    MUSCLE ACHES     Review of Systems  Constitutional: Negative.   Respiratory: Negative.    Cardiovascular: Negative.   Neurological: Negative.   Psychiatric/Behavioral: Negative.       Today's Vitals   08/25/22 1602 08/25/22 1617  BP: (!) 140/70 124/80  Pulse: 91   Temp: 97.7 F (36.5 C)   SpO2: 98%   Weight: 199 lb 6.4 oz (90.4 kg)   Height: '5\' 3"'  (1.6 m)   PainSc: 0-No pain    Body mass index is 35.32 kg/m.  Wt Readings from Last 3 Encounters:  08/25/22 199 lb 6.4 oz (90.4 kg)  07/08/22 190 lb (86.2 kg)  01/29/22 197 lb 9.6 oz (89.6 kg)    Objective:  Physical Exam Vitals reviewed.  Constitutional:      General: She is not in acute distress.    Appearance: Normal appearance. She is well-developed. She is obese.  Eyes:     Pupils: Pupils are equal, round, and reactive to light.   Cardiovascular:     Rate and Rhythm: Normal rate and regular rhythm.     Pulses: Normal pulses.     Heart sounds: Normal heart sounds. No murmur heard. Pulmonary:     Effort: Pulmonary effort is normal. No respiratory distress.     Breath sounds: Normal breath sounds. No wheezing.  Musculoskeletal:        General: Normal range of motion.  Skin:    General: Skin is warm and dry.     Capillary Refill: Capillary refill takes less than 2 seconds.     Findings: No rash.  Neurological:     General: No focal deficit present.     Mental Status: She is alert and oriented to person, place, and time.     Cranial Nerves: No cranial nerve deficit.     Motor: No weakness.  Psychiatric:        Mood and Affect: Mood normal.        Behavior: Behavior normal.        Thought Content: Thought content normal.        Judgment: Judgment normal.         Assessment And Plan:     1. Pure hypercholesterolemia Comments: Cholesterol levels are stable, continue statin, tolerating well - CMP14+EGFR - Hemoglobin A1c - Lipid panel  2. Vitamin D deficiency Comments: Continue vitamin d supplement was normal at last visit.   3. Other fatigue Comments: Will check for metabolic causes.  - Hemoglobin A1c - VITAMIN D 25 Hydroxy (Vit-D Deficiency, Fractures) - Vitamin B12  4. Attention deficit hyperactivity disorder (ADHD), unspecified ADHD type Comments: This is a new diagnosis from her psychiatrist and is now on straterra  5. Class 2 obesity due to excess calories without serious comorbidity with body mass index (BMI) of 35.0 to 35.9 in adult Comments: Will refer her to [facility] and PREP. Will also check for any metabolic causes. She is encouraged to strive for BMI less than 30 to decrease cardiac risk.  - Hemoglobin A1c - Amb Referral To Provider Referral Exercise Program (P.R.E.P) - Amb Ref to Medical Weight Management  6. Other long term (current) drug therapy -  CBC   Patient was given opportunity to ask questions. Patient verbalized understanding of the plan and was able to repeat key elements of the plan. All questions were answered to their satisfaction.  [name],  FNP   I, [name], FNP, have reviewed all documentation for this visit. The documentation on 08/25/22 for the exam, diagnosis, procedures, and orders are all accurate and complete.   IF YOU HAVE BEEN REFERRED TO A SPECIALIST, IT MAY TAKE 1-2 WEEKS TO SCHEDULE/PROCESS THE REFERRAL. IF YOU HAVE NOT HEARD FROM US/SPECIALIST IN TWO WEEKS, PLEASE GIVE [place] A CALL AT [phone].   THE PATIENT IS ENCOURAGED TO PRACTICE SOCIAL DISTANCING DUE TO THE COVID-19 PANDEMIC.

## 2022-08-26 ENCOUNTER — Ambulatory Visit (INDEPENDENT_AMBULATORY_CARE_PROVIDER_SITE_OTHER): Payer: Medicare Other | Admitting: Psychology

## 2022-08-26 DIAGNOSIS — F324 Major depressive disorder, single episode, in partial remission: Secondary | ICD-10-CM

## 2022-08-26 LAB — HEMOGLOBIN A1C
Est. average glucose Bld gHb Est-mCnc: 108 mg/dL
Hgb A1c MFr Bld: 5.4 % (ref 4.8–5.6)

## 2022-08-26 LAB — LIPID PANEL
Chol/HDL Ratio: 4.2 ratio (ref 0.0–4.4)
Cholesterol, Total: 213 mg/dL — ABNORMAL HIGH (ref 100–199)
HDL: 51 mg/dL (ref >39)
LDL Chol Calc (NIH): 130 mg/dL — ABNORMAL HIGH (ref 0–99)
Triglycerides: 182 mg/dL — ABNORMAL HIGH (ref 0–149)
VLDL Cholesterol Cal: 32 mg/dL (ref 5–40)

## 2022-08-26 LAB — CMP14+EGFR
ALT: 28 IU/L (ref 0–32)
AST: 24 IU/L (ref 0–40)
Albumin/Globulin Ratio: 2.4 — ABNORMAL HIGH (ref 1.2–2.2)
Albumin: 4.6 g/dL (ref 3.8–4.9)
Alkaline Phosphatase: 102 IU/L (ref 44–121)
BUN/Creatinine Ratio: 13 (ref 12–28)
BUN: 12 mg/dL (ref 8–27)
Bilirubin Total: 0.4 mg/dL (ref 0.0–1.2)
CO2: 25 mmol/L (ref 20–29)
Calcium: 9.3 mg/dL (ref 8.7–10.3)
Chloride: 101 mmol/L (ref 96–106)
Creatinine, Ser: 0.93 mg/dL (ref 0.57–1.00)
Globulin, Total: 1.9 g/dL (ref 1.5–4.5)
Glucose: 97 mg/dL (ref 70–99)
Potassium: 4.2 mmol/L (ref 3.5–5.2)
Sodium: 142 mmol/L (ref 134–144)
Total Protein: 6.5 g/dL (ref 6.0–8.5)
eGFR: 70 mL/min/{1.73_m2} (ref >59)

## 2022-08-26 LAB — CBC
Hematocrit: 39.5 % (ref 34.0–46.6)
Hemoglobin: 13.7 g/dL (ref 11.1–15.9)
MCH: 31.6 pg (ref 26.6–33.0)
MCHC: 34.7 g/dL (ref 31.5–35.7)
MCV: 91 fL (ref 79–97)
Platelets: 213 10*3/uL (ref 150–450)
RBC: 4.34 x10E6/uL (ref 3.77–5.28)
RDW: 12.2 % (ref 11.7–15.4)
WBC: 7 10*3/uL (ref 3.4–10.8)

## 2022-08-26 LAB — VITAMIN B12: Vitamin B-12: 330 pg/mL (ref 232–1245)

## 2022-08-26 LAB — VITAMIN D 25 HYDROXY (VIT D DEFICIENCY, FRACTURES): Vit D, 25-Hydroxy: 26.3 ng/mL — ABNORMAL LOW (ref 30.0–100.0)

## 2022-08-26 NOTE — Progress Notes (Signed)
[facility] Counselor/Therapist Progress Note  Patient ID: Virginia [name], MRN: [id number]   Date: 08/26/22  Time Spent: 2:36 pm -  3:32 pm : 56 Minutes  Treatment Type: Individual Therapy.  Reported Symptoms: depression and anxiety.   Mental Status Exam: Appearance:  Well Groomed     Behavior: Appropriate  Motor: Normal  Speech/Language:  Clear and Coherent  Affect: Flat  Mood: normal  Thought process: normal  Thought content:   WNL  Sensory/Perceptual disturbances:   WNL  Orientation: oriented to person, place, time/date, and situation  Attention: Good  Concentration: Good  Memory: WNL  Fund of knowledge:  Good  Insight:   Good  Judgment:  Good  Impulse Control: Good   Risk Assessment: Danger to Self:  No Self-injurious Behavior: No Danger to Others: No Duty to Warn:no Physical Aggression / Violence:No  Access to Firearms a concern: No  Gang Involvement:No   Subjective:   Virginia [name] participated from home, via video and consented to treatment. Therapist participated from home office. We met online due to Virginia [name] pandemic. Virginia [name] reviewed the events of the past week. She noted being prescribed Strattera 7m qd by her psychiatric provider. She noted meeting with her PCP  in order to work on weight-loss. Additionally, she noted being referred to [facility]. Additionally, she was provided some dietary directives in the meantime. A release will be completed once she begins her work with [facility] to communicate with the psychologist, Dr. GGlennie Isle She noted a need to reduce the carbs, increase meal frequency, and having available healthful options. She noted previously planning meals but not recently. She noted a lack of mindfulness of what drives her eating habits. Therapist employed principles of BA in relation to scheduling, problem-solving, creating achievable goals, preporation. Therapist provided psycho-education  regarding this approach. We worked on identifying benefits of her goal aside of the weight-loss numbers. Virginia [name] mobility, more flexibility, less pain, better lung capacity, improved self-esteem. Move self-confidence, Mood benefits, feeling accomplished,  and improved attitude. Therapist encouraged Virginia [name] BA principle, be set reasonable expectations, and be mindful of self-talk in regards to lack of perceived progress. Virginia [name] and motivated during the session. Therapist praised Virginia [name] provided supportive therapy.   Interventions: CBT and BA  Diagnosis:  Major depressive disorder with single episode, in partial remission ([facility]   Treatment Plan:  Client Abilities/Strengths Virginia [name] forthcoming and motivated for change.   Support System: Family and friends.   Client Treatment Preferences Outpatient therapy.   Client Statement of Needs Virginia [name] like to "why I let people push my buttons", "how to deal with people better", being more patient, how to communicate better, setting boundaries for self and others, manage symptoms, processing past events, increasing confidence, defining healthy relationship, process relationship with father, improve self-care (healthful eating, exercise),    Treatment Level Weekly  Symptoms  GAD: Feeling nervious, difficulty managing worry, worrying about about different things, trouble relaxing, restlessness, irritability, feeling afraid something awful might happen.  (Status: maintained) Depression: Loss of interest, feeling down, fluctuating sleep, poor appetite, overeating, weight-gain, feeling bad about self, difficulty concentrating.           (Status: maintained)  Goals:   Virginia [name] symptoms of depression and anxiety.    Target Date: 08/11/23 Frequency: Weekly  Progress: 0 Modality: individual    Therapist will provide referrals for additional resources as appropriate.  Therapist will provide psycho-education  regarding Virginia [name]  diagnosis and corresponding treatment approaches and interventions. Licensed Clinical Social Worker, [place], LCSW will support the patient's ability to achieve the goals identified. will employ CBT, BA, Problem-solving, Solution Focused, Mindfulness,  coping skills, & other evidenced-based practices will be used to promote progress towards healthy functioning to help manage decrease symptoms associated with her diagnosis.   Reduce overall level, frequency, and intensity of the feelings of depression, anxiety and panic evidenced by decreased overall symptoms from 6 to 7 days/week to 0 to 1 days/week per client report for at least 3 consecutive months. Verbally express understanding of the relationship between feelings of depression, anxiety and their impact on thinking patterns and behaviors. Verbalize an understanding of the role that distorted thinking plays in creating fears, excessive worry, and ruminations.    Virginia [name] participated in the creation of the treatment plan)  Buena Irish, LCSW

## 2022-08-31 ENCOUNTER — Ambulatory Visit (INDEPENDENT_AMBULATORY_CARE_PROVIDER_SITE_OTHER): Payer: Medicare Other | Admitting: Family Medicine

## 2022-08-31 ENCOUNTER — Ambulatory Visit: Payer: Medicare Other | Admitting: Nurse Practitioner

## 2022-08-31 ENCOUNTER — Encounter (INDEPENDENT_AMBULATORY_CARE_PROVIDER_SITE_OTHER): Payer: Self-pay | Admitting: Family Medicine

## 2022-08-31 VITALS — BP 115/76 | HR 85 | Temp 97.7°F | Ht 63.0 in | Wt 192.0 lb

## 2022-08-31 DIAGNOSIS — E559 Vitamin D deficiency, unspecified: Secondary | ICD-10-CM | POA: Diagnosis not present

## 2022-08-31 DIAGNOSIS — E7849 Other hyperlipidemia: Secondary | ICD-10-CM

## 2022-08-31 DIAGNOSIS — Z0289 Encounter for other administrative examinations: Secondary | ICD-10-CM

## 2022-08-31 DIAGNOSIS — E669 Obesity, unspecified: Secondary | ICD-10-CM | POA: Diagnosis not present

## 2022-08-31 DIAGNOSIS — Z6834 Body mass index (BMI) 34.0-34.9, adult: Secondary | ICD-10-CM

## 2022-08-31 DIAGNOSIS — M1712 Unilateral primary osteoarthritis, left knee: Secondary | ICD-10-CM

## 2022-09-07 ENCOUNTER — Ambulatory Visit: Payer: Medicare Other | Admitting: Psychology

## 2022-09-07 DIAGNOSIS — F324 Major depressive disorder, single episode, in partial remission: Secondary | ICD-10-CM

## 2022-09-07 NOTE — Progress Notes (Signed)
[facility] Counselor/Therapist Progress Note  Patient ID: MAIE KESINGER, MRN: [id number]   Date: 09/07/22  Time Spent: 2:06 pm -  2:40 pm :34 Minutes  Treatment Type: Individual Therapy.  Reported Symptoms: depression and anxiety.   Mental Status Exam: Appearance:  Well Groomed     Behavior: Appropriate  Motor: Normal  Speech/Language:  Clear and Coherent  Affect: Congruent  Mood: normal  Thought process: normal  Thought content:   WNL  Sensory/Perceptual disturbances:   WNL  Orientation: oriented to person, place, time/date, and situation  Attention: Good  Concentration: Good  Memory: WNL  Fund of knowledge:  Good  Insight:   Good  Judgment:  Good  Impulse Control: Good   Risk Assessment: Danger to Self:  No Self-injurious Behavior: No Danger to Others: No Duty to Warn:no Physical Aggression / Violence:No  Access to Firearms a concern: No  Gang Involvement:No   Subjective:   Loman Brooklyn participated from home, via video and consented to treatment. Therapist participated from home office. We met online due to [place] pandemic. Ayde reviewed the events of the past week. She noted continued effort towards healthful eating and met with her weight-loss journey. She noted increasing her protein and eating more regularly. She noted feeling pretty good in that regards and getting additional exercise. She noted her efforts to set boundaries with self and others in regards types of support she receives. She noted often attracting people who struggle to receive feedback or not be in control. She noted warning signs including being "hot headed", dishonesty, taking credit for the work of others. Therapist encouraged Lalania to create a list of relationship boundaries going forward and areas of needed increased awareness. Marketta was engaged and motivated during the session. Therapist praised Oniya and provided supportive therapy.   Interventions: interpersonal  Diagnosis:   Major depressive disorder with single episode, in partial remission [facility])   Treatment Plan:  Client Abilities/Strengths Evelise is forthcoming and motivated for change.   Support System: Family and friends.   Client Treatment Preferences Outpatient therapy.   Client Statement of Needs Yarel would like to "why I let people push my buttons", "how to deal with people better", being more patient, how to communicate better, setting boundaries for self and others, manage symptoms, processing past events, increasing confidence, defining healthy relationship, process relationship with father, improve self-care (healthful eating, exercise),    Treatment Level Weekly  Symptoms  GAD: Feeling nervious, difficulty managing worry, worrying about about different things, trouble relaxing, restlessness, irritability, feeling afraid something awful might happen.  (Status: maintained) Depression: Loss of interest, feeling down, fluctuating sleep, poor appetite, overeating, weight-gain, feeling bad about self, difficulty concentrating.           (Status: maintained)  Goals:   Karlynn experiences symptoms of depression and anxiety.    Target Date: 08/11/23 Frequency: Weekly  Progress: 0 Modality: individual    Therapist will provide referrals for additional resources as appropriate.  Therapist will provide psycho-education regarding Lanyah's diagnosis and corresponding treatment approaches and interventions. Licensed Clinical Social Worker, [place], LCSW will support the patient's ability to achieve the goals identified. will employ CBT, BA, Problem-solving, Solution Focused, Mindfulness,  coping skills, & other evidenced-based practices will be used to promote progress towards healthy functioning to help manage decrease symptoms associated with her diagnosis.   Reduce overall level, frequency, and intensity of the feelings of depression, anxiety and panic evidenced by decreased overall symptoms from  6 to 7 days/week  to 0 to 1 days/week per client report for at least 3 consecutive months. Verbally express understanding of the relationship between feelings of depression, anxiety and their impact on thinking patterns and behaviors. Verbalize an understanding of the role that distorted thinking plays in creating fears, excessive worry, and ruminations.    [name] participated in the creation of the treatment plan)  Buena Irish, LCSW

## 2022-09-08 NOTE — Progress Notes (Signed)
Chief Complaint:  Today's visit was #: information session Starting weight: 192 lbs Starting date: 08/31/2022 Today's date:  08/31/2022  Interim History: Referred by her PCP.  She has noticed more SOB with weight gain.  Weight has slowly increased over time.  Used to work on an active job.  Had the habit of meal skipping and over eating at night.   Subjective:   1. Osteoarthritis of left knee, unspecified osteoarthritis type S/P total knee replacement.  Having knee pain now that limits exercise.   2. Other hyperlipidemia She is taking atorvastatin 40 mg 5 days per week.  08/25/22, FLP reviewed.  Total cholesterol 213, Triglycerides 182, LDL 130, HDL 51.   3. Vitamin D deficiency She is currently taking prescription vitamin D 50,000 IU each week. She denies nausea, vomiting or muscle weakness.Vitamin D level 26.3, prescription managed by her PCP.   Assessment/Plan:   1. Osteoarthritis of left knee, unspecified osteoarthritis type Look for improvements in knee pain with weight reduction  2. Other hyperlipidemia Look for improvements in lipids with healthy diet, regular exercise and weight reduction.  3. Vitamin D deficiency Continue prescription Vitamin D through her PCP.   4. Obesity,current BMI 34.0 1) Reviewed obesity as a disease.  2) Reviewed Bioimpedance with patient.  3) Reviewed our program information.   Virginia [name] has agreed to follow-up with our clinic in 2 weeks. She was informed of the importance of frequent follow-up visits to maximize her success with intensive lifestyle modifications for her multiple health conditions.   Objective:   Blood pressure 115/76, pulse 85, temperature 97.7 F (36.5 C), height '5\' 3"'$  (1.6 m), weight 192 lb (87.1 kg), SpO2 94 %. Body mass index is 34.01 kg/m.  General: Cooperative, alert, well developed, in no acute distress. HEENT: Conjunctivae and lids unremarkable. Cardiovascular: Regular rhythm.  Lungs: Normal work of  breathing. Neurologic: No focal deficits.   Lab Results  Component Value Date   CREATININE 0.93 08/25/2022   BUN 12 08/25/2022   NA 142 08/25/2022   K 4.2 08/25/2022   CL 101 08/25/2022   CO2 25 08/25/2022   Lab Results  Component Value Date   ALT 28 08/25/2022   AST 24 08/25/2022   ALKPHOS 102 08/25/2022   BILITOT 0.4 08/25/2022   Lab Results  Component Value Date   HGBA1C 5.4 08/25/2022   HGBA1C 5.4 11/06/2020   HGBA1C 5.4 09/27/2019   Lab Results  Component Value Date   INSULIN 9.7 04/24/2020   Lab Results  Component Value Date   TSH 0.822 12/05/2018   Lab Results  Component Value Date   CHOL 213 (H) 08/25/2022   HDL 51 08/25/2022   LDLCALC 130 (H) 08/25/2022   TRIG 182 (H) 08/25/2022   CHOLHDL 4.2 08/25/2022   Lab Results  Component Value Date   VD25OH 26.3 (L) 08/25/2022   VD25OH 41.5 01/29/2022   VD25OH 24.4 (L) 11/06/2020   Lab Results  Component Value Date   WBC 7.0 08/25/2022   HGB 13.7 08/25/2022   HCT 39.5 08/25/2022   MCV 91 08/25/2022   PLT 213 08/25/2022   No results found for: "IRON", "TIBC", "FERRITIN"  Attestation Statements:   Reviewed by clinician on day of visit: allergies, medications, problem list, medical history, surgical history, family history, social history, and previous encounter notes.  Time spent on visit including pre-visit chart review and post-visit care and charting was 30 minutes.   I, Davy Pique, am acting as Location manager for Virginia [name]  Virginia Lenn, DO.  I have reviewed the above documentation for accuracy and completeness, and I agree with the above. Dell Ponto, DO

## 2022-09-16 ENCOUNTER — Encounter (INDEPENDENT_AMBULATORY_CARE_PROVIDER_SITE_OTHER): Payer: Self-pay | Admitting: Family Medicine

## 2022-09-16 ENCOUNTER — Ambulatory Visit (INDEPENDENT_AMBULATORY_CARE_PROVIDER_SITE_OTHER): Payer: Medicare Other | Admitting: Family Medicine

## 2022-09-16 VITALS — BP 125/81 | HR 83 | Temp 97.9°F | Ht 63.0 in | Wt 187.0 lb

## 2022-09-16 DIAGNOSIS — R5383 Other fatigue: Secondary | ICD-10-CM | POA: Diagnosis not present

## 2022-09-16 DIAGNOSIS — M1712 Unilateral primary osteoarthritis, left knee: Secondary | ICD-10-CM | POA: Diagnosis not present

## 2022-09-16 DIAGNOSIS — E669 Obesity, unspecified: Secondary | ICD-10-CM | POA: Diagnosis not present

## 2022-09-16 DIAGNOSIS — F339 Major depressive disorder, recurrent, unspecified: Secondary | ICD-10-CM

## 2022-09-16 DIAGNOSIS — R0602 Shortness of breath: Secondary | ICD-10-CM | POA: Diagnosis not present

## 2022-09-16 DIAGNOSIS — E7849 Other hyperlipidemia: Secondary | ICD-10-CM

## 2022-09-16 DIAGNOSIS — Z6833 Body mass index (BMI) 33.0-33.9, adult: Secondary | ICD-10-CM

## 2022-09-16 DIAGNOSIS — E559 Vitamin D deficiency, unspecified: Secondary | ICD-10-CM | POA: Diagnosis not present

## 2022-09-17 LAB — T4, FREE: Free T4: 1.27 ng/dL (ref 0.82–1.77)

## 2022-09-17 LAB — FOLATE: Folate: >20 ng/mL (ref >3.0)

## 2022-09-17 LAB — TSH: TSH: 3.17 u[IU]/mL (ref 0.450–4.500)

## 2022-09-17 LAB — INSULIN, RANDOM: INSULIN: 8 u[IU]/mL (ref 2.6–24.9)

## 2022-09-21 ENCOUNTER — Ambulatory Visit: Payer: Medicare Other | Admitting: Psychology

## 2022-09-21 DIAGNOSIS — F324 Major depressive disorder, single episode, in partial remission: Secondary | ICD-10-CM

## 2022-09-21 NOTE — Progress Notes (Signed)
[facility] [place] [facility] Counselor/Therapist Progress Note  Patient ID: CORRIE REDER, MRN: [id number]   Date: 09/21/22  Time Spent: 3:07 pm -  3:22  pm :   15 Minutes  Treatment Type: Individual Therapy.  Reported Symptoms: depression and anxiety.   Mental Status Exam: Appearance:  Well Groomed     Behavior: Appropriate  Motor: Normal  Speech/Language:  Clear and Coherent  Affect: Congruent  Mood: normal  Thought process: normal  Thought content:   WNL  Sensory/Perceptual disturbances:   WNL  Orientation: oriented to person, place, time/date, and situation  Attention: Good  Concentration: Good  Memory: WNL  Fund of knowledge:  Good  Insight:   Good  Judgment:  Good  Impulse Control: Good   Risk Assessment: Danger to Self:  No Self-injurious Behavior: No Danger to Others: No Duty to Warn:no Physical Aggression / Violence:No  Access to Firearms a concern: No  Gang Involvement:No   Subjective:   Loman Brooklyn participated from home, via video and consented to treatment. Therapist participated from home office. We met online due to [place] pandemic. Almeda reviewed the events of the past week. She noted being out of Strattera and reaching out to her provider with delays. She noted having a follow-up on 11/8. She noted losing some weight regarding her diet and discussed this being partly due to visiting her doctor. She noted indicators of progress in relation to weight-loss including feeling better about self, not feeling bloated, clothes fitting better, weight-loss on scale. She will work on filling out the list of indicators of healthy and unhealthy relationship dynamics. Therapist encouraged Aneliese to create a list of relationship boundaries going forward and areas of needed increased awareness. Adelina was engaged and motivated during the session. Therapist praised Jachelle and provided supportive therapy.   Interventions: interpersonal  Diagnosis:  Major depressive disorder with  single episode, in partial remission [facility])   Treatment Plan:  Client Abilities/Strengths Ikeisha is forthcoming and motivated for change.   Support System: Family and friends.   Client Treatment Preferences Outpatient therapy.   Client Statement of Needs Roshell would like to "why I let people push my buttons", "how to deal with people better", being more patient, how to communicate better, setting boundaries for self and others, manage symptoms, processing past events, increasing confidence, defining healthy relationship, process relationship with father, improve self-care (healthful eating, exercise),    Treatment Level Weekly  Symptoms  GAD: Feeling nervious, difficulty managing worry, worrying about about different things, trouble relaxing, restlessness, irritability, feeling afraid something awful might happen.  (Status: maintained) Depression: Loss of interest, feeling down, fluctuating sleep, poor appetite, overeating, weight-gain, feeling bad about self, difficulty concentrating.           (Status: maintained)  Goals:   Shronda experiences symptoms of depression and anxiety.    Target Date: 08/11/23 Frequency: Weekly  Progress: 0 Modality: individual    Therapist will provide referrals for additional resources as appropriate.  Therapist will provide psycho-education regarding Lorrene's diagnosis and corresponding treatment approaches and interventions. Licensed Clinical Social Worker, [place], LCSW will support the patient's ability to achieve the goals identified. will employ CBT, BA, Problem-solving, Solution Focused, Mindfulness,  coping skills, & other evidenced-based practices will be used to promote progress towards healthy functioning to help manage decrease symptoms associated with her diagnosis.   Reduce overall level, frequency, and intensity of the feelings of depression, anxiety and panic evidenced by decreased overall symptoms from 6 to 7 days/week to 0  to 1  days/week per client report for at least 3 consecutive months. Verbally express understanding of the relationship between feelings of depression, anxiety and their impact on thinking patterns and behaviors. Verbalize an understanding of the role that distorted thinking plays in creating fears, excessive worry, and ruminations.    [name] participated in the creation of the treatment plan)  Buena Irish, LCSW

## 2022-09-22 ENCOUNTER — Ambulatory Visit (INDEPENDENT_AMBULATORY_CARE_PROVIDER_SITE_OTHER): Payer: Medicare Other | Admitting: Acute Care

## 2022-09-22 ENCOUNTER — Ambulatory Visit
Admission: RE | Admit: 2022-09-22 | Discharge: 2022-09-22 | Disposition: A | Discharge status: Home / Self Care | Payer: Medicare Other | Source: Ambulatory Visit | Attending: Acute Care | Admitting: Acute Care

## 2022-09-22 ENCOUNTER — Encounter: Payer: Self-pay | Admitting: Acute Care

## 2022-09-22 DIAGNOSIS — Z87891 Personal history of nicotine dependence: Secondary | ICD-10-CM

## 2022-09-22 DIAGNOSIS — Z122 Encounter for screening for malignant neoplasm of respiratory organs: Secondary | ICD-10-CM

## 2022-09-22 DIAGNOSIS — I700 Atherosclerosis of aorta: Secondary | ICD-10-CM | POA: Diagnosis not present

## 2022-09-22 DIAGNOSIS — J432 Centrilobular emphysema: Secondary | ICD-10-CM | POA: Diagnosis not present

## 2022-09-22 DIAGNOSIS — I2510 Atherosclerotic heart disease of native coronary artery without angina pectoris: Secondary | ICD-10-CM | POA: Diagnosis not present

## 2022-09-22 NOTE — Patient Instructions (Signed)
Thank you for participating in the [place] [facility] Scre[facility] Program. It was our pleasure to meet you today. We will call you with the results of your scan within the next few days. Your scan will be assigned a Lung RADS category score by the physicians reading the scans.  This Lung RADS score determines follow up scanning.  See below for description of categories, and follow up screening recommendations. We will be in touch to schedule your follow up screening annually or based on recommendations of our providers. We will fax a copy of your scan results to your Primary Care Physician, or the physician who referred you to the program, to ensure they have the results. Please call the office if you have any questions or concerns regarding your scanning experience or results.  Our office number is [phone]. Please speak with Denise Phelps, RN. , or  Denise Buckner RN, They are  our Lung Cancer Screening RN.'s If They are unavailable when you call, Please leave a message on the voice mail. We will return your call at our earliest convenience.This voice mail is monitored several times a day.  Remember, if your scan is normal, we will scan you annually as long as you continue to meet the criteria for the program. (Age 55-77, Current smoker or smoker who has quit within the last 15 years). If you are a smoker, remember, quitting is the single most powerful action that you can take to decrease your risk of lung cancer and other pulmonary, breathing related problems. We know quitting is hard, and we are here to help.  Please let us know if there is anything we can do to help you meet your goal of quitting. If you are a former smoker, congratulations. We are proud of you! Remain smoke free! Remember you can refer friends or family members through the number above.  We will screen them to make sure they meet criteria for the program. Thank you for helping us take better care of you by  participating in Lung Screening.  You can receive free nicotine replacement therapy ( patches, gum or mints) by calling 1-800-QUIT NOW. Please call so we can get you on the path to becoming  a non-smoker. I know it is hard, but you can do this!  Lung RADS Categories:  Lung RADS 1: no nodules or definitely non-concerning nodules.  Recommendation is for a repeat annual scan in 12 months.  Lung RADS 2:  nodules that are non-concerning in appearance and behavior with a very low likelihood of becoming an active cancer. Recommendation is for a repeat annual scan in 12 months.  Lung RADS 3: nodules that are probably non-concerning , includes nodules with a low likelihood of becoming an active cancer.  Recommendation is for a 6-month repeat screening scan. Often noted after an upper respiratory illness. We will be in touch to make sure you have no questions, and to schedule your 6-month scan.  Lung RADS 4 A: nodules with concerning findings, recommendation is most often for a follow up scan in 3 months or additional testing based on our provider's assessment of the scan. We will be in touch to make sure you have no questions and to schedule the recommended 3 month follow up scan.  Lung RADS 4 B:  indicates findings that are concerning. We will be in touch with you to schedule additional diagnostic testing based on our provider's  assessment of the scan.  Other options for assistance in smoking cessation (   As covered by your insurance benefits)  Hypnosis for smoking cessation  [employer] [phone]  Acupuncture for smoking cessation  [facility] [phone]   

## 2022-09-22 NOTE — Progress Notes (Signed)
Virtual Visit via Telephone Note  I connected with Virginia [name] on 09/22/22 at 11:00 AM EST by telephone and verified that I am speaking with the correct person using two identifiers.  Location: Patient:  At home Provider:  [address], [place], [place], [address]    I discussed the limitations, risks, security and privacy concerns of performing an evaluation and management service by telephone and the availability of in person appointments. I also discussed with the patient that there may be a patient responsible charge related to this service. The patient expressed understanding and agreed to proceed.   Shared Decision Making Visit Lung Cancer Screening Program [phone])   Eligibility: Age 61 y.o. Pack Years Smoking History Calculation 107.25 pack year smoking history (# packs/per year x # years smoked) Recent History of coughing up blood  no Unexplained weight loss? no ( >Than 15 pounds within the last 6 months ) Prior History Lung / other cancer no (Diagnosis within the last 5 years already requiring surveillance chest CT Scans). Smoking Status Former Smoker Former Smokers: Years since quit: 5 years  Quit Date: 02/2017  Visit Components: Discussion included one or more decision making aids. yes Discussion included risk/benefits of screening. yes Discussion included potential follow up diagnostic testing for abnormal scans. yes Discussion included meaning and risk of over diagnosis. yes Discussion included meaning and risk of False Positives. yes Discussion included meaning of total radiation exposure. yes  Counseling Included: Importance of adherence to annual lung cancer LDCT screening. yes Impact of comorbidities on ability to participate in the program. yes Ability and willingness to under diagnostic treatment. yes  Smoking Cessation Counseling: Current Smokers:  Discussed importance of smoking cessation. yes Information about tobacco cessation classes and  interventions provided to patient. yes Patient provided with "ticket" for LDCT Scan. yes Symptomatic Patient. no  Counseling NA Diagnosis Code: Tobacco Use Z72.0 Asymptomatic Patient yes  Counseling (Intermediate counseling: > three minutes counseling) V7846 Former Smokers:  Discussed the importance of maintaining cigarette abstinence. yes Diagnosis Code: Personal History of Nicotine Dependence. N62.952 Information about tobacco cessation classes and interventions provided to patient. Yes Patient provided with "ticket" for LDCT Scan. yes Written Order for Lung Cancer Screening with LDCT placed in Epic. Yes (CT Chest Lung Cancer Screening Low Dose W/O CM) [id number] Z12.2-Screening of respiratory organs Z87.891-Personal history of nicotine dependence  I spent 25 minutes of face to face time/virtual visit time  with  Virginia [name] discussing the risks and benefits of lung cancer screening. We took the time to pause the power point at intervals to allow for questions to be asked and answered to ensure understanding. We discussed that she had taken the single most powerful action possible to decrease her risk of developing lung cancer when she quit smoking. I counseled her to remain smoke free, and to contact me if she ever had the desire to smoke again so that I can provide resources and tools to help support the effort to remain smoke free. We discussed the time and location of the scan, and that either  Virginia Glassman RN, Virginia Prince, RN or I  or I will call / send a letter with the results within  24-72 hours of receiving them. She has the office contact information in the event she needs to speak with me,  she verbalized understanding of all of the above and had no further questions upon leaving the office.     I explained to the patient that there has  been a high incidence of coronary artery disease noted on these exams. I explained that this is a non-gated exam therefore degree or severity  cannot be determined. This patient is not on statin therapy. I have asked the patient to follow-up with their PCP regarding any incidental finding of coronary artery disease and management with diet or medication as they feel is clinically indicated. The patient verbalized understanding of the above and had no further questions.     Virginia Spatz, NP 09/22/2022

## 2022-09-24 ENCOUNTER — Encounter: Payer: Self-pay | Admitting: Internal Medicine

## 2022-09-25 ENCOUNTER — Telehealth: Payer: Self-pay | Admitting: Acute Care

## 2022-09-25 DIAGNOSIS — Z122 Encounter for screening for malignant neoplasm of respiratory organs: Secondary | ICD-10-CM

## 2022-09-25 DIAGNOSIS — R911 Solitary pulmonary nodule: Secondary | ICD-10-CM

## 2022-09-25 DIAGNOSIS — Z87891 Personal history of nicotine dependence: Secondary | ICD-10-CM

## 2022-09-25 NOTE — Telephone Encounter (Signed)
I have called the patient with the results of her low-dose screening CT.  I explained that her scan was read as a lung RADS 4A, suspicious.  There is a 6 mm solid nodule in the right lower lobe that we will recheck in 3 months with a follow-up screening CT.  She is in agreement with this plan. Denise please fax results to patient's PCP and place order for 51-monthfollow-up screening CT to reevaluate 6 mm right lower lobe nodule. Thanks so much

## 2022-09-25 NOTE — Telephone Encounter (Signed)
Results faxed to PCP with plan. Order placed for 3 month LCS follow up CT

## 2022-09-29 NOTE — Progress Notes (Unsigned)
Chief Complaint:   Virginia [name] (MR# [id number]) is a 61 y.o. female who presents for evaluation and treatment of obesity and related comorbidities. Current BMI is Body mass index is 33.13 kg/m. Virginia [name] has been struggling with her weight for many years and has been unsuccessful in either losing weight, maintaining weight loss, or reaching her healthy weight goal.  Patient wished to adhere to weight of 145 lbs.  She has never used AOM's.  She denies big portions.  She likes starches and sweets.  Plans to start going to the gym.  Tends to skip meals, eats early dinner and a big late night snack.  Avoids SSB's.  Her dad lives with her.   Virginia [name] is currently in the action stage of change and ready to dedicate time achieving and maintaining a healthier weight. Virginia [name] is interested in becoming our patient and working on intensive lifestyle modifications including (but not limited to) diet and exercise for weight loss.  Virginia [name]'s habits were reviewed today and are as follows: Her family eats meals together, she thinks her family will eat healthier with her, her desired weight loss is 51 lbs, she has been heavy most of her life, she started gaining weight at age 17, her heaviest weight ever was 199 pounds, she snacks frequently in the evenings, she skips meals frequently, she is frequently drinking liquids with calories, she frequently eats larger portions than normal, and she struggles with emotional eating.  Depression Screen Virginia [name]'s Food and Mood (modified PHQ-9) score was 11.     09/16/2022    7:17 AM  Depression screen PHQ 2/9  Decreased Interest 2  Down, Depressed, Hopeless 3  PHQ - 2 Score 5  Altered sleeping 0  Tired, decreased energy 1  Change in appetite 1  Feeling bad or failure about yourself  2  Trouble concentrating 2  Moving slowly or fidgety/restless 0  Suicidal thoughts 0  PHQ-9 Score 11  Difficult doing work/chores Not difficult at all   Subjective:   1. Other  fatigue EKG reviewed RBBB.  Unchanged  from EKG 2012.  Arie denies daytime somnolence and denies waking up still tired. Patient has a history of symptoms of N/A. Virginia [name] generally gets  6-8  hours of sleep per night, and states that she has generally restful sleep. Snoring is present. Apneic episodes are not present. Epworth Sleepiness Score is 0.    2. SOBOE (shortness of breath on exertion) Virginia [name] notes increasing shortness of breath with exercising and seems to be worsening over time with weight gain. She notes getting out of breath sooner with activity than she used to. This has not gotten worse recently. Areta denies shortness of breath at rest or orthopnea.  3. Osteoarthritis of left knee, unspecified osteoarthritis type S/P total knee replacement, able to walk on stable ground but stairs, hills invoke pain.  Anticipates a decrease in knee pain with weight loss.    4. Other hyperlipidemia She is on Lipitor 40 mg 5 days per week.  Denies myalgias.  FLP reviewed, 08/25/2022, total cholesterol, 213, triglycerides 182, HDL 51, LDL 130.    5. Vitamin D deficiency Vitamin D level reviewed from 08/25/2022, 26.3.  unsure of last DEXA scan recently started prescription Vitamin D, 50,000 IU weekly through PCP.   6. Recurrent major depressive disorder, remission status unspecified ([place]) Her mood is stable on Pristiq 100 mg daily.  PHQ 9:11, reports good support system.   Assessment/Plan:   1. Other fatigue Labs  reviewed from 08/25/2022, updated today.  Virginia [name] does feel that her weight is causing her energy to be lower than it should be. Fatigue may be related to obesity, depression or many other causes. Labs will be ordered, and in the meanwhile, Virginia [name] will focus on self care including making healthy food choices, increasing physical activity and focusing on stress reduction.  - EKG 12-Lead - TSH - T4, free - Insulin, random - Folate  2. SOBOE (shortness of breath on exertion) Virginia [name] does feel that  she gets out of breath more easily that she used to when she exercises. Virginia [name]'s shortness of breath appears to be obesity related and exercise induced. She has agreed to work on weight loss and gradually increase exercise to treat her exercise induced shortness of breath. Will continue to monitor closely.  3. Osteoarthritis of left knee, unspecified osteoarthritis type Exercise as tolerated, treadmill, elliptical, weight training.  Follow up with ortho if knee pain worsens.   4. Other hyperlipidemia Look for improvements in lipid profile with dietary changes and weight loss.   5. Vitamin D deficiency Continue prescription Vitamin D weekly.  Recheck level in 3-4 months.   6. Recurrent major depressive disorder, remission status unspecified ([place]) Virginia [name] had a positive depression screening. Depression is commonly associated with obesity and often results in emotional eating behaviors. We will monitor this closely and work on CBT to help improve the non-hunger eating patterns. Referral to Psychology may be required if no improvement is seen as she continues in our clinic. Continue Pristiq prescription per PCP.   7. Obesity,current BMI 33.3 Virginia [name] is currently in the action stage of change and her goal is to continue with weight loss efforts. I recommend Coy begin the structured treatment plan as follows:  She has agreed to the Category 2 Plan.  Exercise goals:  Start at a gym 2 times a week.     Behavioral modification strategies: increasing lean protein intake, increasing vegetables, increasing water intake, decreasing eating out, no skipping meals, meal planning and cooking strategies, keeping healthy foods in the home, ways to avoid night time snacking, and planning for success.  She was informed of the importance of frequent follow-up visits to maximize her success with intensive lifestyle modifications for her multiple health conditions. She was informed we would discuss her lab results at her  next visit unless there is a critical issue that needs to be addressed sooner. Myrah agreed to keep her next visit at the agreed upon time to discuss these results.  Objective:   Blood pressure 125/81, pulse 83, temperature 97.9 F (36.6 C), height '5\' 3"'$  (1.6 m), weight 187 lb (84.8 kg), SpO2 95 %. Body mass index is 33.13 kg/m.  EKG: Normal sinus rhythm, rate 82 BPM.  Indirect Calorimeter completed today shows a VO2 of 240 and a REE of 1656.  Her calculated basal metabolic rate is 5176 thus her basal metabolic rate is better than expected.  General: Cooperative, alert, well developed, in no acute distress. HEENT: Conjunctivae and lids unremarkable. Cardiovascular: Regular rhythm.  Lungs: Normal work of breathing. Neurologic: No focal deficits.   Lab Results  Component Value Date   CREATININE 0.93 08/25/2022   BUN 12 08/25/2022   NA 142 08/25/2022   K 4.2 08/25/2022   CL 101 08/25/2022   CO2 25 08/25/2022   Lab Results  Component Value Date   ALT 28 08/25/2022   AST 24 08/25/2022   ALKPHOS 102 08/25/2022   BILITOT 0.4 08/25/2022  Lab Results  Component Value Date   HGBA1C 5.4 08/25/2022   HGBA1C 5.4 11/06/2020   HGBA1C 5.4 09/27/2019   Lab Results  Component Value Date   INSULIN 8.0 09/16/2022   INSULIN 9.7 04/24/2020   Lab Results  Component Value Date   TSH 3.170 09/16/2022   Lab Results  Component Value Date   CHOL 213 (H) 08/25/2022   HDL 51 08/25/2022   LDLCALC 130 (H) 08/25/2022   TRIG 182 (H) 08/25/2022   CHOLHDL 4.2 08/25/2022   Lab Results  Component Value Date   WBC 7.0 08/25/2022   HGB 13.7 08/25/2022   HCT 39.5 08/25/2022   MCV 91 08/25/2022   PLT 213 08/25/2022   No results found for: "IRON", "TIBC", "FERRITIN"  Attestation Statements:   Reviewed by clinician on day of visit: allergies, medications, problem list, medical history, surgical history, family history, social history, and previous encounter notes.  Time spent on visit  including pre-visit chart review and post-visit charting and care was 40 minutes.   I, Davy Pique, am acting as Location manager for Loyal Gambler, DO.  I have reviewed the above documentation for accuracy and completeness, and I agree with the above. - ***

## 2022-09-30 ENCOUNTER — Encounter (INDEPENDENT_AMBULATORY_CARE_PROVIDER_SITE_OTHER): Payer: Self-pay | Admitting: Family Medicine

## 2022-09-30 ENCOUNTER — Ambulatory Visit (INDEPENDENT_AMBULATORY_CARE_PROVIDER_SITE_OTHER): Payer: Medicare Other | Admitting: Family Medicine

## 2022-09-30 VITALS — BP 106/76 | HR 91 | Temp 97.5°F | Ht 63.0 in | Wt 185.0 lb

## 2022-09-30 DIAGNOSIS — Z6832 Body mass index (BMI) 32.0-32.9, adult: Secondary | ICD-10-CM

## 2022-09-30 DIAGNOSIS — E669 Obesity, unspecified: Secondary | ICD-10-CM

## 2022-09-30 DIAGNOSIS — E559 Vitamin D deficiency, unspecified: Secondary | ICD-10-CM | POA: Diagnosis not present

## 2022-09-30 DIAGNOSIS — M170 Bilateral primary osteoarthritis of knee: Secondary | ICD-10-CM | POA: Diagnosis not present

## 2022-10-05 ENCOUNTER — Ambulatory Visit: Payer: Medicare Other | Admitting: Psychology

## 2022-10-05 DIAGNOSIS — F909 Attention-deficit hyperactivity disorder, unspecified type: Secondary | ICD-10-CM

## 2022-10-05 DIAGNOSIS — F324 Major depressive disorder, single episode, in partial remission: Secondary | ICD-10-CM

## 2022-10-05 NOTE — Progress Notes (Signed)
[facility] Counselor/Therapist Progress Note  Patient ID: Virginia [name], MRN: [id number]   Date: 10/05/22  Time Spent: 3:34 pm -  4:11 pm :   36 Minutes  Treatment Type: Individual Therapy.  Reported Symptoms: depression and anxiety.   Mental Status Exam: Appearance:  Well Groomed     Behavior: Appropriate  Motor: Normal  Speech/Language:  Clear and Coherent  Affect: Congruent  Mood: normal  Thought process: normal  Thought content:   WNL  Sensory/Perceptual disturbances:   WNL  Orientation: oriented to person, place, time/date, and situation  Attention: Good  Concentration: Good  Memory: WNL  Fund of knowledge:  Good  Insight:   Good  Judgment:  Good  Impulse Control: Good   Risk Assessment: Danger to Self:  No Self-injurious Behavior: No Danger to Others: No Duty to Warn:no Physical Aggression / Violence:No  Access to Firearms a concern: No  Gang Involvement:No   Subjective:   Loman Brooklyn participated from home, via video and consented to treatment. Therapist participated from home office. We met online due to [place] pandemic. Harleyquinn reviewed the events of the past week. Danice noted being "wound tight". She noted her anxiety level being up due to the holidays. She noted being stressed during family functions. She noted feeling over stimulated during this time. She noted relief with breaks. She noted additional pressure to bring food. Separately, she noted having a tough time not being able to "fix things" for her friends. She noted continuing to take her Atomoxetine 50 mg and found it to be "pretty good". She noted difficulty managing her dietary intake during stressful times. She noted having a popsicle but worked on mitigating this drive. She noted her goals for weight loss and discussed that her doctor disagreed with the goals.  We sent underlined to be around 135 pounds and her doctor stating that she should weigh more based off of her age and height.   We will work on exploring this going forward.  Therapist provided flexibility in this area.  Brynja noted her intent to manage her eating during Thanksgiving by eating more protein and less carbs as her diet typically stipulates.  Therapist praised Navie for her planning towards this and validated her feelings.  Therapist provided supportive therapy.  Kathrene would benefit from continued treatment and follow-up was scheduled.  Interventions: interpersonal & cbt  Diagnosis:  Major depressive disorder with single episode, in partial remission [facility])   Treatment Plan:  Client Abilities/Strengths Athaliah is forthcoming and motivated for change.   Support System: Family and friends.   Client Treatment Preferences Outpatient therapy.   Client Statement of Needs Ellayna would like to "why I let people push my buttons", "how to deal with people better", being more patient, how to communicate better, setting boundaries for self and others, manage symptoms, processing past events, increasing confidence, defining healthy relationship, process relationship with father, improve self-care (healthful eating, exercise),    Treatment Level Weekly  Symptoms  GAD: Feeling nervious, difficulty managing worry, worrying about about different things, trouble relaxing, restlessness, irritability, feeling afraid something awful might happen.  (Status: maintained) Depression: Loss of interest, feeling down, fluctuating sleep, poor appetite, overeating, weight-gain, feeling bad about self, difficulty concentrating.           (Status: maintained)  Goals:   Emersynn experiences symptoms of depression and anxiety.    Target Date: 08/11/23 Frequency: Weekly  Progress: 0 Modality: individual    Therapist will provide referrals for additional resources  as appropriate.  Therapist will provide psycho-education regarding Onalee's diagnosis and corresponding treatment approaches and interventions. Licensed Clinical Social Worker,  [place], LCSW will support the patient's ability to achieve the goals identified. will employ CBT, BA, Problem-solving, Solution Focused, Mindfulness,  coping skills, & other evidenced-based practices will be used to promote progress towards healthy functioning to help manage decrease symptoms associated with her diagnosis.   Reduce overall level, frequency, and intensity of the feelings of depression, anxiety and panic evidenced by decreased overall symptoms from 6 to 7 days/week to 0 to 1 days/week per client report for at least 3 consecutive months. Verbally express understanding of the relationship between feelings of depression, anxiety and their impact on thinking patterns and behaviors. Verbalize an understanding of the role that distorted thinking plays in creating fears, excessive worry, and ruminations.    [name] participated in the creation of the treatment plan)  Buena Irish, LCSW

## 2022-10-12 NOTE — Progress Notes (Unsigned)
Chief Complaint:   OBESITY Virginia [name] is here to discuss her progress with her obesity treatment plan along with follow-up of her obesity related diagnoses. Virginia [name] is on the Category 2 Plan and states she is following her eating plan approximately 95% of the time. Virginia [name] states she is walking 10 minutes 2 times per week.  Today's visit was #: 2 Starting weight: 187 lbs Starting date: 09/16/2022 Today's weight: 185 lbs Today's date: 09/30/2022 Total lbs lost to date: 2 lbs Total lbs lost since last in-office visit: 2 lbs  Interim History: She is doing better eating on schedule.  Used to skip meals.  Gets hungry 1 hours after taking Ambien at night.  She added a greek yogurt.  She has a good support system.  She denies hunger or cravings.  Time is a barrier to exercise.   Subjective:   1. Osteoarthritis of both knees, unspecified osteoarthritis type S/P left total knee replacement and several arthroscopic surgeries on right knee.   2. Vitamin D deficiency She is currently taking prescription vitamin D 50,000 IU each week, through PCP. She denies nausea, vomiting or muscle weakness. Last Vitamin D level 26.3.   Assessment/Plan:   1. Osteoarthritis of both knees, unspecified osteoarthritis type Look for improvement with further weight loss.  Exercise as tolerated.    2. Vitamin D deficiency Continue prescription Vitamin D weekly.   3. Obesity,current BMI 32.8 1) Reviewed plan to increase exercise time. 2) Reminded her about the importance of fruits, veggies on her plan.   Virginia [name] is currently in the action stage of change. As such, her goal is to continue with weight loss efforts. She has agreed to the Category 2 Plan.   Exercise goals:  Aim for 2-3 times per week walking or home workout.   Behavioral modification strategies: increasing lean protein intake, increasing vegetables, increasing water intake, decreasing liquid calories, increasing high fiber foods, decreasing eating out, no  skipping meals, meal planning and cooking strategies, keeping healthy foods in the home, and holiday eating strategies .  Virginia [name] has agreed to follow-up with our clinic in 3 weeks. She was informed of the importance of frequent follow-up visits to maximize her success with intensive lifestyle modifications for her multiple health conditions.   Objective:   Blood pressure 106/76, pulse 91, temperature (!) 97.5 F (36.4 C), height '5\' 3"'$  (1.6 m), weight 185 lb (83.9 kg), SpO2 96 %. Body mass index is 32.77 kg/m.  General: Cooperative, alert, well developed, in no acute distress. HEENT: Conjunctivae and lids unremarkable. Cardiovascular: Regular rhythm.  Lungs: Normal work of breathing. Neurologic: No focal deficits.   Lab Results  Component Value Date   CREATININE 0.93 08/25/2022   BUN 12 08/25/2022   NA 142 08/25/2022   K 4.2 08/25/2022   CL 101 08/25/2022   CO2 25 08/25/2022   Lab Results  Component Value Date   ALT 28 08/25/2022   AST 24 08/25/2022   ALKPHOS 102 08/25/2022   BILITOT 0.4 08/25/2022   Lab Results  Component Value Date   HGBA1C 5.4 08/25/2022   HGBA1C 5.4 11/06/2020   HGBA1C 5.4 09/27/2019   Lab Results  Component Value Date   INSULIN 8.0 09/16/2022   INSULIN 9.7 04/24/2020   Lab Results  Component Value Date   TSH 3.170 09/16/2022   Lab Results  Component Value Date   CHOL 213 (H) 08/25/2022   HDL 51 08/25/2022   LDLCALC 130 (H) 08/25/2022   TRIG 182 (H)  08/25/2022   CHOLHDL 4.2 08/25/2022   Lab Results  Component Value Date   VD25OH 26.3 (L) 08/25/2022   VD25OH 41.5 01/29/2022   VD25OH 24.4 (L) 11/06/2020   Lab Results  Component Value Date   WBC 7.0 08/25/2022   HGB 13.7 08/25/2022   HCT 39.5 08/25/2022   MCV 91 08/25/2022   PLT 213 08/25/2022   No results found for: "IRON", "TIBC", "FERRITIN"  Attestation Statements:   Reviewed by clinician on day of visit: allergies, medications, problem list, medical history, surgical  history, family history, social history, and previous encounter notes.  I, Davy Pique, am acting as Location manager for Loyal Gambler, DO.  I have reviewed the above documentation for accuracy and completeness, and I agree with the above. Dell Ponto, DO

## 2022-10-21 ENCOUNTER — Ambulatory Visit (INDEPENDENT_AMBULATORY_CARE_PROVIDER_SITE_OTHER): Payer: Medicare Other | Admitting: Family Medicine

## 2022-10-21 ENCOUNTER — Telehealth: Payer: Self-pay

## 2022-10-21 ENCOUNTER — Encounter (INDEPENDENT_AMBULATORY_CARE_PROVIDER_SITE_OTHER): Payer: Self-pay | Admitting: Family Medicine

## 2022-10-21 VITALS — BP 113/80 | HR 73 | Temp 98.2°F | Ht 63.0 in | Wt 185.2 lb

## 2022-10-21 DIAGNOSIS — E669 Obesity, unspecified: Secondary | ICD-10-CM

## 2022-10-21 DIAGNOSIS — E559 Vitamin D deficiency, unspecified: Secondary | ICD-10-CM | POA: Diagnosis not present

## 2022-10-21 DIAGNOSIS — F338 Other recurrent depressive disorders: Secondary | ICD-10-CM

## 2022-10-21 DIAGNOSIS — M25562 Pain in left knee: Secondary | ICD-10-CM

## 2022-10-21 DIAGNOSIS — R7989 Other specified abnormal findings of blood chemistry: Secondary | ICD-10-CM | POA: Diagnosis not present

## 2022-10-21 DIAGNOSIS — Z6832 Body mass index (BMI) 32.0-32.9, adult: Secondary | ICD-10-CM

## 2022-10-21 DIAGNOSIS — M25561 Pain in right knee: Secondary | ICD-10-CM

## 2022-10-21 NOTE — Telephone Encounter (Signed)
Called to discuss PREP program schedule at [facility] in December, she wants to call me back later today

## 2022-10-22 DIAGNOSIS — M25561 Pain in right knee: Secondary | ICD-10-CM | POA: Insufficient documentation

## 2022-10-22 DIAGNOSIS — F338 Other recurrent depressive disorders: Secondary | ICD-10-CM | POA: Insufficient documentation

## 2022-10-22 DIAGNOSIS — R7989 Other specified abnormal findings of blood chemistry: Secondary | ICD-10-CM | POA: Insufficient documentation

## 2022-10-26 ENCOUNTER — Encounter: Payer: Medicare Other | Admitting: Nurse Practitioner

## 2022-10-26 NOTE — Patient Instructions (Signed)
Obesity, Adult ?Obesity is having too much body fat. Being obese means that your weight is more than what is healthy for you.  ?BMI (body mass index) is a number that explains how much body fat you have. If you have a BMI of 30 or more, you are obese. ?Obesity can cause serious health problems, such as: ?Stroke. ?Coronary artery disease (CAD). ?Type 2 diabetes. ?Some types of cancer. ?High blood pressure (hypertension). ?High cholesterol. ?Gallbladder stones. ?Obesity can also contribute to: ?Osteoarthritis. ?Sleep apnea. ?Infertility problems. ?What are the causes? ?Eating meals each day that are high in calories, sugar, and fat. ?Drinking a lot of drinks that have sugar in them. ?Being born with genes that may make you more likely to become obese. ?Having a medical condition that causes obesity. ?Taking certain medicines. ?Sitting a lot (having a sedentary lifestyle). ?Not getting enough sleep. ?What increases the risk? ?Having a family history of obesity. ?Living in an area with limited access to: ?Parks, recreation centers, or sidewalks. ?Healthy food choices, such as grocery stores and farmers' markets. ?What are the signs or symptoms? ?The main sign is having too much body fat. ?How is this treated? ?Treatment for this condition often includes changing your lifestyle. Treatment may include: ?Changing your diet. This may include making a healthy meal plan. ?Exercise. This may include activity that causes your heart to beat faster (aerobic exercise) and strength training. Work with your doctor to design a program that works for you. ?Medicine to help you lose weight. This may be used if you are not able to lose one pound a week after 6 weeks of healthy eating and more exercise. ?Treating conditions that cause the obesity. ?Surgery. Options may include gastric banding and gastric bypass. This may be done if: ?Other treatments have not helped to improve your condition. ?You have a BMI of 40 or higher. ?You have  life-threatening health problems related to obesity. ?Follow these instructions at home: ?Eating and drinking ? ?Follow advice from your doctor about what to eat and drink. Your doctor may tell you to: ?Limit fast food, sweets, and processed snack foods. ?Choose low-fat options. For example, choose low-fat milk instead of whole milk. ?Eat five or more servings of fruits or vegetables each day. ?Eat at home more often. This gives you more control over what you eat. ?Choose healthy foods when you eat out. ?Learn to read food labels. This will help you learn how much food is in one serving. ?Keep low-fat snacks available. ?Avoid drinks that have a lot of sugar in them. These include soda, fruit juice, iced tea with sugar, and flavored milk. ?Drink enough water to keep your pee (urine) pale yellow. ?Do not go on fad diets. ?Physical activity ?Exercise often, as told by your doctor. Most adults should get up to 150 minutes of moderate-intensity exercise every week.Ask your doctor: ?What types of exercise are safe for you. ?How often you should exercise. ?Warm up and stretch before being active. ?Do slow stretching after being active (cool down). ?Rest between times of being active. ?Lifestyle ?Work with your doctor and a food expert (dietitian) to set a weight-loss goal that is best for you. ?Limit your screen time. ?Find ways to reward yourself that do not involve food. ?Do not drink alcohol if: ?Your doctor tells you not to drink. ?You are pregnant, may be pregnant, or are planning to become pregnant. ?If you drink alcohol: ?Limit how much you have to: ?0-1 drink a day for women. ?0-2 drinks   a day for men. ?Know how much alcohol is in your drink. In the [place] one drink equals one 12 oz bottle of beer (355 mL), one 5 oz glass of wine (148 mL), or one 1? oz glass of hard liquor (44 mL). ?General instructions ?Keep a weight-loss journal. This can help you keep track of: ?The food that you eat. ?How much exercise you  get. ?Take over-the-counter and prescription medicines only as told by your doctor. ?Take vitamins and supplements only as told by your doctor. ?Think about joining a support group. ?Pay attention to your mental health as obesity can lead to depression or self esteem issues. ?Keep all follow-up visits. ?Contact a doctor if: ?You cannot meet your weight-loss goal after you have changed your diet and lifestyle for 6 weeks. ?You are having trouble breathing. ?Summary ?Obesity is having too much body fat. ?Being obese means that your weight is more than what is healthy for you. ?Work with your doctor to set a weight-loss goal. ?Get regular exercise as told by your doctor. ?This information is not intended to replace advice given to you by your health care provider. Make sure you discuss any questions you have with your health care provider. ?Document Revised: 06/10/2021 Document Reviewed: 06/10/2021 ?Elsevier Patient Education ? 2023 [employer] Inc[employer] ? ?

## 2022-10-26 NOTE — Progress Notes (Signed)
Not seen

## 2022-10-28 ENCOUNTER — Ambulatory Visit: Payer: Medicare Other | Admitting: Psychology

## 2022-10-28 DIAGNOSIS — F324 Major depressive disorder, single episode, in partial remission: Secondary | ICD-10-CM | POA: Diagnosis not present

## 2022-10-28 DIAGNOSIS — F909 Attention-deficit hyperactivity disorder, unspecified type: Secondary | ICD-10-CM

## 2022-10-28 NOTE — Progress Notes (Signed)
Chief Complaint:   OBESITY Virginia [name] is here to discuss her progress with her obesity treatment plan along with follow-up of her obesity related diagnoses. Virginia [name] is on the Category 2 Plan and states she is following her eating plan approximately 85% of the time. Virginia [name] states she is walking 5-7 minutes 1 times per week.  Today's visit was #: 3 Starting weight: 187 LBS Starting date: 09/16/2022 Today's weight: 185 LBS Today's date: 10/21/2022 Total lbs lost to date: 2 LBS Total lbs lost since last in-office visit: 0  Interim History: Patient is eating special K raspberries with 2% milk, protein shakes and Greek yogurt for lunch.  A [place] sandwich on low calorie bread and a fruit  with no night snacking.  Patient was eating a lot of grapes.  She avoids SSB's.  Subjective:   1. Pain in both knees, unspecified chronicity Patient is doing well with walking 1 times per week with minimal pain.  2. Vitamin D deficiency Patient is on prescription vitamin D per PCP 50,000 IU weekly.  Last vitamin D level was 26.3 on 08/25/2022.  3. Low vitamin B12 level B12 level is low, normal at 330 on 05/22/2022.  Patient complains of fatigue, but denies paraesthesias.   4. Seasonal affective disorder ([place]) Patient is complaining of more fatigue and flat mood over the past month.  Patient denies acute stressors.  This occurs annually this time a year.  She is on Pristiq 100 mg daily for MDD.  Assessment/Plan:   1. Pain in both knees, unspecified chronicity Okay to increase her walking and appropriate shoes to 30 minutes 3 times a week.  2. Vitamin D deficiency Plan to recheck level in February 2024.  3. Low vitamin B12 level Began OTC B complex daily.  4. Seasonal affective disorder ([place]) Recommend outdoors sunlight, exercise 30 minutes 3-5 times a week for lightbox therapy.  5. Obesity,current BMI 32.8 1.  Okay to change special K to special K protein with fair life milk. 2.  Add and 30 minutes of  outdoor walking 3 days/week. 3.  Increase water intake to 80 ounces per day.  Virginia [name] is currently in the action stage of change. As such, her goal is to continue with weight loss efforts. She has agreed to the Category 2 Plan.   Exercise goals:  Increase walking time.  Behavioral modification strategies: increasing lean protein intake, increasing vegetables, increasing water intake, meal planning and cooking strategies, holiday eating strategies , and planning for success.  Virginia [name] has agreed to follow-up with our clinic in 4 weeks. She was informed of the importance of frequent follow-up visits to maximize her success with intensive lifestyle modifications for her multiple health conditions.  d sooner. Virginia [name] agreed to keep her next visit at the agreed upon time to discuss these results.  Objective:   Blood pressure 113/80, pulse 73, temperature 98.2 F (36.8 C), height '5\' 3"'$  (1.6 m), weight 185 lb 3.2 oz (84 kg), SpO2 96 %. Body mass index is 32.81 kg/m.  General: Cooperative, alert, well developed, in no acute distress. HEENT: Conjunctivae and lids unremarkable. Cardiovascular: Regular rhythm.  Lungs: Normal work of breathing. Neurologic: No focal deficits.   Lab Results  Component Value Date   CREATININE 0.93 08/25/2022   BUN 12 08/25/2022   NA 142 08/25/2022   K 4.2 08/25/2022   CL 101 08/25/2022   CO2 25 08/25/2022   Lab Results  Component Value Date   ALT 28 08/25/2022   AST  24 08/25/2022   ALKPHOS 102 08/25/2022   BILITOT 0.4 08/25/2022   Lab Results  Component Value Date   HGBA1C 5.4 08/25/2022   HGBA1C 5.4 11/06/2020   HGBA1C 5.4 09/27/2019   Lab Results  Component Value Date   INSULIN 8.0 09/16/2022   INSULIN 9.7 04/24/2020   Lab Results  Component Value Date   TSH 3.170 09/16/2022   Lab Results  Component Value Date   CHOL 213 (H) 08/25/2022   HDL 51 08/25/2022   LDLCALC 130 (H) 08/25/2022   TRIG 182 (H) 08/25/2022   CHOLHDL 4.2 08/25/2022   Lab  Results  Component Value Date   VD25OH 26.3 (L) 08/25/2022   VD25OH 41.5 01/29/2022   VD25OH 24.4 (L) 11/06/2020   Lab Results  Component Value Date   WBC 7.0 08/25/2022   HGB 13.7 08/25/2022   HCT 39.5 08/25/2022   MCV 91 08/25/2022   PLT 213 08/25/2022   No results found for: "IRON", "TIBC", "FERRITIN"  Attestation Statements:   Reviewed by clinician on day of visit: allergies, medications, problem list, medical history, surgical history, family history, social history, and previous encounter notes.  I, Virginia [name], am acting as Location manager for Virginia Gambler, DO.  I have reviewed the above documentation for accuracy and completeness, and I agree with the above. Dell Ponto, DO

## 2022-10-28 NOTE — Progress Notes (Signed)
[facility] Counselor/Therapist Progress Note  Patient ID: Virginia [name], MRN: [id number]   Date: 10/28/22  Time Spent: 2:32 pm -  3:21 pm :   49 Minutes  Treatment Type: Individual Therapy.  Reported Symptoms: depression and anxiety.   Mental Status Exam: Appearance:  Well Groomed     Behavior: Appropriate  Motor: Normal  Speech/Language:  Clear and Coherent  Affect: Congruent  Mood: normal  Thought process: normal  Thought content:   WNL  Sensory/Perceptual disturbances:   WNL  Orientation: oriented to person, place, time/date, and situation  Attention: Good  Concentration: Good  Memory: WNL  Fund of knowledge:  Good  Insight:   Good  Judgment:  Good  Impulse Control: Good   Risk Assessment: Danger to Self:  No Self-injurious Behavior: No Danger to Others: No Duty to Warn:no Physical Aggression / Violence:No  Access to Firearms a concern: No  Gang Involvement:No   Subjective:   Virginia [name] participated from car, via video and consented to treatment. Therapist participated from home office. We met online due to Virginia [name] pandemic. Virginia [name] reviewed the events of the past week.She noted continuing to mind her intake and noted losing some weight. She noted anticipating stress, during the holidays, due to the amount people attending. She noted being more mindful of her how she engages with others. She noted some improvement in her dynamics with her father. She noted exercising but needing to increase the frequency to 3x per week. She noted barriers to going to the gym includes being around people, being committed to frequent exercise, lack of interest. We delineated her social anxiety noting feeling at times watched or judged. We worked on identifying her self-talk. She noted discomfort being in a crowd (30+). She noted this discomforted developed in adulthood (late 40's). She noted feeling "out of place". She noted her self-confidence playing a part. She noted being  accused of being a pot head by someone and noted discomfort, to this day, when around that person. She noted her worry of what others are thinking is "the first thing I think". We discussed wise-mind (DBT skill) and ways to challenged negative thoughts and feelings. Handouts were provided via email for review and reference. Therapist modeled this during the session. Therapist normalized Virginia [name]'s experience and discussed the importance of challenging distortions. Psycho-education was provided. Therapist provided supportive therapy.  Virginia [name] would benefit from continued treatment and follow-up was scheduled.  Interventions: interpersonal & cbt  Diagnosis:  Major depressive disorder with single episode, in partial remission (Virginia [name])  Attention deficit hyperactivity disorder (ADHD), unspecified ADHD type   Treatment Plan:  Client Abilities/Strengths Virginia [name] is forthcoming and motivated for change.   Support System: Family and friends.   Client Treatment Preferences Outpatient therapy.   Client Statement of Needs Virginia [name] would like to "why I let people push my buttons", "how to deal with people better", being more patient, how to communicate better, setting boundaries for self and others, manage symptoms, processing past events, increasing confidence, defining healthy relationship, process relationship with father, improve self-care (healthful eating, exercise),    Treatment Level Weekly  Symptoms  GAD: Feeling nervious, difficulty managing worry, worrying about about different things, trouble relaxing, restlessness, irritability, feeling afraid something awful might happen.  (Status: maintained) Depression: Loss of interest, feeling down, fluctuating sleep, poor appetite, overeating, weight-gain, feeling bad about self, difficulty concentrating.           (Status: maintained)  Goals:   Virginia [name] experiences symptoms of depression and  anxiety.    Target Date: 08/11/23 Frequency: Weekly  Progress: 0  Modality: individual    Therapist will provide referrals for additional resources as appropriate.  Therapist will provide psycho-education regarding Virginia [name]'s diagnosis and corresponding treatment approaches and interventions. Licensed Clinical Social Worker, [place], LCSW will support the patient's ability to achieve the goals identified. will employ CBT, BA, Problem-solving, Solution Focused, Mindfulness,  coping skills, & other evidenced-based practices will be used to promote progress towards healthy functioning to help manage decrease symptoms associated with her diagnosis.   Reduce overall level, frequency, and intensity of the feelings of depression, anxiety and panic evidenced by decreased overall symptoms from 6 to 7 days/week to 0 to 1 days/week per client report for at least 3 consecutive months. Verbally express understanding of the relationship between feelings of depression, anxiety and their impact on thinking patterns and behaviors. Verbalize an understanding of the role that distorted thinking plays in creating fears, excessive worry, and ruminations.    Virginia [name] participated in the creation of the treatment plan)  Buena Irish, LCSW

## 2022-11-13 ENCOUNTER — Telehealth: Payer: Self-pay

## 2022-11-13 NOTE — Telephone Encounter (Signed)
Called again to discuss PREP program referral; left voicemail

## 2022-11-17 ENCOUNTER — Ambulatory Visit (INDEPENDENT_AMBULATORY_CARE_PROVIDER_SITE_OTHER): Payer: Medicare Other | Admitting: Family Medicine

## 2022-11-23 ENCOUNTER — Other Ambulatory Visit: Payer: Self-pay | Admitting: Internal Medicine

## 2022-11-24 ENCOUNTER — Ambulatory Visit (INDEPENDENT_AMBULATORY_CARE_PROVIDER_SITE_OTHER): Payer: Medicare Other | Admitting: Family Medicine

## 2022-11-24 VITALS — BP 103/71 | HR 86 | Temp 97.9°F | Ht 63.0 in | Wt 179.0 lb

## 2022-11-24 DIAGNOSIS — F338 Other recurrent depressive disorders: Secondary | ICD-10-CM | POA: Diagnosis not present

## 2022-11-24 DIAGNOSIS — R7989 Other specified abnormal findings of blood chemistry: Secondary | ICD-10-CM

## 2022-11-24 DIAGNOSIS — E559 Vitamin D deficiency, unspecified: Secondary | ICD-10-CM | POA: Diagnosis not present

## 2022-11-24 DIAGNOSIS — E669 Obesity, unspecified: Secondary | ICD-10-CM

## 2022-11-24 DIAGNOSIS — Z6831 Body mass index (BMI) 31.0-31.9, adult: Secondary | ICD-10-CM

## 2022-11-25 ENCOUNTER — Encounter (INDEPENDENT_AMBULATORY_CARE_PROVIDER_SITE_OTHER): Payer: Self-pay | Admitting: Family Medicine

## 2022-12-02 ENCOUNTER — Ambulatory Visit: Payer: Medicare Other | Admitting: Psychology

## 2022-12-02 DIAGNOSIS — F324 Major depressive disorder, single episode, in partial remission: Secondary | ICD-10-CM

## 2022-12-02 DIAGNOSIS — F909 Attention-deficit hyperactivity disorder, unspecified type: Secondary | ICD-10-CM

## 2022-12-02 NOTE — Progress Notes (Signed)
[facility] Counselor/Therapist Progress Note  Patient ID: Virginia [name], MRN: [id number]   Date: 12/02/22  Time Spent: 1:32 pm -  1:53 pm :   21 Minutes  Treatment Type: Individual Therapy.  Reported Symptoms: depression and anxiety.   Mental Status Exam: Appearance:  Well Groomed     Behavior: Appropriate  Motor: Normal  Speech/Language:  Clear and Coherent  Affect: Congruent  Mood: normal  Thought process: normal  Thought content:   WNL  Sensory/Perceptual disturbances:   WNL  Orientation: oriented to person, place, time/date, and situation  Attention: Good  Concentration: Good  Memory: WNL  Fund of knowledge:  Good  Insight:   Good  Judgment:  Good  Impulse Control: Good   Risk Assessment: Danger to Self:  No Self-injurious Behavior: No Danger to Others: No Duty to Warn:no Physical Aggression / Violence:No  Access to Firearms a concern: No  Gang Involvement:No   Subjective:   Virginia [name] participated from home, via video and consented to treatment. Therapist participated from home office. We met online due to Virginia [name] pandemic. Virginia [name] reviewed the events of the past week. Virginia [name] noted continuing to work on increasing her exercise in the neighborhood but noted not making it to the gym yet. She noted stressors in relation to work on her car and noted the financial stressors related to. We worked on identifying her stressors in relation and worked on identifying her coping skills during the session. She noted completing the therapeutic assignment reading the wise-mind handout. She noted continued efforts towards weight-loss and noted losing 20#. She discussed less knee pain and more endurance. She noted increased efforts to drink more water, per her provider's direction, specifically 80 oz. She noted working on maintaining the progress she is making. She noted struggles with remembering to eat lunch due to past behavior. She noted watching the time to ensure that  she is eating regularly. Therapist praised Virginia [name] for her effort and energy towards her health and self-care. Therapist encouraged Virginia [name] to identify additional of areas of concentration for the upcoming session. Therapist provided supportive therapy.  Virginia [name] would benefit from continued treatment and follow-up was scheduled.  Interventions: BA and CBT  Diagnosis:  Major depressive disorder with single episode, in partial remission ([place])  Attention deficit hyperactivity disorder (ADHD), unspecified ADHD type   Treatment Plan:  Client Abilities/Strengths Virginia [name] is forthcoming and motivated for change.   Support System: Family and friends.   Client Treatment Preferences Outpatient therapy.   Client Statement of Needs Virginia [name] would like to "why I let people push my buttons", "how to deal with people better", being more patient, how to communicate better, setting boundaries for self and others, manage symptoms, processing past events, increasing confidence, defining healthy relationship, process relationship with father, improve self-care (healthful eating, exercise),    Treatment Level Weekly  Symptoms  GAD: Feeling nervious, difficulty managing worry, worrying about about different things, trouble relaxing, restlessness, irritability, feeling afraid something awful might happen.  (Status: maintained) Depression: Loss of interest, feeling down, fluctuating sleep, poor appetite, overeating, weight-gain, feeling bad about self, difficulty concentrating.           (Status: maintained)  Goals:   Virginia [name] experiences symptoms of depression and anxiety.    Target Date: 08/11/23 Frequency: Weekly  Progress: 0 Modality: individual    Therapist will provide referrals for additional resources as appropriate.  Therapist will provide psycho-education regarding Trinady's diagnosis and corresponding treatment approaches and interventions. Licensed Clinical Education officer, museum, [employer]  [place]ia [place], LCSW will support the  patient's ability to achieve the goals identified. will employ CBT, BA, Problem-solving, Solution Focused, Mindfulness,  coping skills, & other evidenced-based practices will be used to promote progress towards healthy functioning to help manage decrease symptoms associated with her diagnosis.   Reduce overall level, frequency, and intensity of the feelings of depression, anxiety and panic evidenced by decreased overall symptoms from 6 to 7 days/week to 0 to 1 days/week per client report for at least 3 consecutive months. Verbally express understanding of the relationship between feelings of depression, anxiety and their impact on thinking patterns and behaviors. Verbalize an understanding of the role that distorted thinking plays in creating fears, excessive worry, and ruminations.    Virginia [name] participated in the creation of the treatment plan)  Buena Irish, LCSW

## 2022-12-14 NOTE — Progress Notes (Unsigned)
Chief Complaint:   OBESITY Quincie is here to discuss her progress with her obesity treatment plan along with follow-up of her obesity related diagnoses. Kachina is on the Category 2 Plan and states she is following her eating plan approximately 75% of the time. [place] states she walking for 30 minutes 3 times per week.  Today's visit was #: 4 Starting weight: 187 lbs Starting date: 09/16/22 Today's weight: 179 lbs Today's date: 11/24/22 Total lbs lost to date: 8 Total lbs lost since last in-office visit: -6  Interim History: Doing better with meal planning.  Mindful about food choices.  Gets water in.  Walking indoors 30 minutes 3 times per week.  Knee pain slightly bothers her.  Motivated to continue to work on weight loss.  Subjective:   1. Low vitamin B12 level Started a B complex daily. Energy level improving. Denies paresthesias.  2. Vitamin D deficiency Taking prescription vitamin D 50,000 IU weekly-prescribed by PCP. Energy level improving.  3. Seasonal affective disorder ([facility]) Taking Pristiq 100 mg daily.  Has a good support system. Trying to be more consistent with walking and getting sunlight 5-10 minutes daily.   Assessment/Plan:   1. Low vitamin B12 level Recheck B12 level next visit.  2. Vitamin D deficiency Recheck vitamin D level next visit.  3. Seasonal affective disorder ([place]) Continue to work on supportive care.  4. Obesity,current BMI 31.7 Vernia is currently in the action stage of change. As such, her goal is to continue with weight loss efforts. She has agreed to the Category 2 Plan.   Exercise goals:  as is  Behavioral modification strategies: increasing lean protein intake, increasing vegetables, increasing water intake, decreasing eating out, no skipping meals, meal planning and cooking strategies, keeping healthy foods in the home, and planning for success.  Kaniyah has agreed to follow-up with our clinic in 4 weeks. She was informed of the  importance of frequent follow-up visits to maximize her success with intensive lifestyle modifications for her multiple health conditions.   Objective:   Blood pressure 103/71, pulse 86, temperature 97.9 F (36.6 C), height '5\' 3"'$  (1.6 m), weight 179 lb (81.2 kg), SpO2 96 %. Body mass index is 31.71 kg/m.  General: Cooperative, alert, well developed, in no acute distress. HEENT: Conjunctivae and lids unremarkable. Cardiovascular: Regular rhythm.  Lungs: Normal work of breathing. Neurologic: No focal deficits.   Lab Results  Component Value Date   CREATININE 0.93 08/25/2022   BUN 12 08/25/2022   NA 142 08/25/2022   K 4.2 08/25/2022   CL 101 08/25/2022   CO2 25 08/25/2022   Lab Results  Component Value Date   ALT 28 08/25/2022   AST 24 08/25/2022   ALKPHOS 102 08/25/2022   BILITOT 0.4 08/25/2022   Lab Results  Component Value Date   HGBA1C 5.4 08/25/2022   HGBA1C 5.4 11/06/2020   HGBA1C 5.4 09/27/2019   Lab Results  Component Value Date   INSULIN 8.0 09/16/2022   INSULIN 9.7 04/24/2020   Lab Results  Component Value Date   TSH 3.170 09/16/2022   Lab Results  Component Value Date   CHOL 213 (H) 08/25/2022   HDL 51 08/25/2022   LDLCALC 130 (H) 08/25/2022   TRIG 182 (H) 08/25/2022   CHOLHDL 4.2 08/25/2022   Lab Results  Component Value Date   VD25OH 26.3 (L) 08/25/2022   VD25OH 41.5 01/29/2022   VD25OH 24.4 (L) 11/06/2020   Lab Results  Component Value Date  WBC 7.0 08/25/2022   HGB 13.7 08/25/2022   HCT 39.5 08/25/2022   MCV 91 08/25/2022   PLT 213 08/25/2022   No results found for: "IRON", "TIBC", "FERRITIN"   Attestation Statements:   Reviewed by clinician on day of visit: allergies, medications, problem list, medical history, surgical history, family history, social history, and previous encounter notes.  I, Georgianne Fick, FNP, am acting as transcriptionist for Dr. Loyal Gambler.  I have reviewed the above documentation for accuracy and  completeness, and I agree with the above. Dell Ponto, DO

## 2022-12-24 ENCOUNTER — Ambulatory Visit
Admission: RE | Admit: 2022-12-24 | Discharge: 2022-12-24 | Disposition: A | Discharge status: Home / Self Care | Payer: Medicare Other | Source: Ambulatory Visit | Attending: Acute Care | Admitting: Acute Care

## 2022-12-24 DIAGNOSIS — J439 Emphysema, unspecified: Secondary | ICD-10-CM | POA: Diagnosis not present

## 2022-12-24 DIAGNOSIS — R918 Other nonspecific abnormal finding of lung field: Secondary | ICD-10-CM | POA: Diagnosis not present

## 2022-12-24 DIAGNOSIS — R911 Solitary pulmonary nodule: Secondary | ICD-10-CM

## 2022-12-24 DIAGNOSIS — Z87891 Personal history of nicotine dependence: Secondary | ICD-10-CM

## 2022-12-24 DIAGNOSIS — I700 Atherosclerosis of aorta: Secondary | ICD-10-CM | POA: Diagnosis not present

## 2022-12-28 ENCOUNTER — Telehealth: Payer: Self-pay | Admitting: Acute Care

## 2022-12-28 DIAGNOSIS — Z87891 Personal history of nicotine dependence: Secondary | ICD-10-CM

## 2022-12-28 DIAGNOSIS — R918 Other nonspecific abnormal finding of lung field: Secondary | ICD-10-CM

## 2022-12-28 NOTE — Telephone Encounter (Signed)
Mychart message sent by pt wanting a call to have recent CT results dicussed. Routing to lung nodule pool.

## 2022-12-28 NOTE — Telephone Encounter (Signed)
Spoke with patient by phone to review findings of recent LDCT per review notes by Eric Form, NP.  Patient denies any recent illness or increase in congestion.  Advised new nodule is very small and as precaution it is advised to take a look at it in 6 months versus waiting a year.  Patient also noted the previous nodule had increased in size by 65m.  Explained that it was considered stable by radiology but this nodule will also be looked at again each time a CT chest is performed. Any changes in either nodule will be addressed at the 6 months follow up CT.  Patient acknowledged understanding and is agreeable to the 6 months LDCT.  Order placed for follow up LDCT and results/plan faxed to PCP

## 2022-12-28 NOTE — Telephone Encounter (Signed)
Please call patient and let her know the nodule of concern noted in the November 2023 scan is stable, and we will repeat the scan in 6 months to re-evaluate for stability. There was also a notation of a very small new nodule that we will again, re-evaluate in 6 months. Denise,please place order for 06/2023 and fax results to PCP , letting them know the plan. Thanks so much.

## 2022-12-29 ENCOUNTER — Encounter (INDEPENDENT_AMBULATORY_CARE_PROVIDER_SITE_OTHER): Payer: Self-pay | Admitting: Family Medicine

## 2022-12-29 ENCOUNTER — Ambulatory Visit (INDEPENDENT_AMBULATORY_CARE_PROVIDER_SITE_OTHER): Payer: Medicare Other | Admitting: Family Medicine

## 2022-12-29 VITALS — BP 100/69 | HR 75 | Temp 97.9°F | Ht 63.0 in | Wt 175.0 lb

## 2022-12-29 DIAGNOSIS — E669 Obesity, unspecified: Secondary | ICD-10-CM

## 2022-12-29 DIAGNOSIS — R7989 Other specified abnormal findings of blood chemistry: Secondary | ICD-10-CM

## 2022-12-29 DIAGNOSIS — E559 Vitamin D deficiency, unspecified: Secondary | ICD-10-CM

## 2022-12-29 DIAGNOSIS — Z6831 Body mass index (BMI) 31.0-31.9, adult: Secondary | ICD-10-CM | POA: Diagnosis not present

## 2022-12-29 NOTE — Assessment & Plan Note (Signed)
Improving Net weight loss 12 pounds in 3 months which is a 6.4% total body weight loss on prescribed meal plan adding in more consistent walking.  We set a goal for 30 minutes 3 or more days a week.  She is welcome to check out [facility] well for additional exercise options.  Reviewed bioimpedance results.  Still losing lean muscle mass.

## 2022-12-29 NOTE — Progress Notes (Signed)
Office: [phone]  /  Fax: [place] Weight Loss Height: 5' 3"$  (1.6 m) Weight: 179 lb (81.2 kg) Temp: 97.9 F (36.6 C) Pulse Rate: 75 BP: 100/69 SpO2: 95 % Fasting: no Labs: no Today's Visit #: 5 Weight at Last VIsit: 179lb Weight Lost Since Last Visit: 4lb  Body Fat %: 42.5 % Fat Mass (lbs): 74.8 lbs Muscle Mass (lbs): 95.8 lbs Total Body Water (lbs): 68.2 lbs Visceral Fat Rating : 11 Starting Date: 09/16/22 Starting Weight: 187lb Total Weight Loss (lbs): 12 lb (5.443 kg)    HPI  Chief Complaint: OBESITY  Virginia [name] is here to discuss her progress with her obesity treatment plan. She is on the the Category 2 Plan and states she is following her eating plan approximately 85 % of the time. She states she is walking for 30 minutes 2-3 times per week.   Interval History:  Since last office visit she is down 4 lb since her last.  She has a net loss of 12 lb in the last 3 mos.  Doing some walking but energy level remains low.  Has been stressed about lung nodules on CT scan.  Denies stress eating. Likes Special K protein cereal + fairlife milk for breakfast.  Pharmacotherapy: none  PHYSICAL EXAM:  Blood pressure 100/69, pulse 75, temperature 97.9 F (36.6 C), height 5' 3"$  (1.6 m), weight 175 lb (79.4 kg), SpO2 95 %. Body mass index is 31 kg/m.  General: She is overweight, cooperative, alert, well developed, and in no acute distress. PSYCH: Has normal mood, affect and thought process.   HEENT: EOMI, sclerae are anicteric. Lungs: Normal breathing effort, no conversational dyspnea. Extremities: No edema.  Neurologic: No gross sensory or motor deficits. No tremors or fasciculations noted.    DIAGNOSTIC DATA REVIEWED:  BMET    Component Value Date/Time   NA 142 08/25/2022 1720   K 4.2 08/25/2022 1720   CL 101 08/25/2022 1720   CO2 25 08/25/2022 1720   GLUCOSE 97 08/25/2022 1720   GLUCOSE 110 (H) 05/06/2011 0650   BUN  12 08/25/2022 1720   CREATININE 0.93 08/25/2022 1720   CALCIUM 9.3 08/25/2022 1720   GFRNONAA 79 11/06/2020 1509   GFRAA 91 11/06/2020 1509   Lab Results  Component Value Date   HGBA1C 5.4 08/25/2022   HGBA1C 5.4 09/27/2019   Lab Results  Component Value Date   INSULIN 8.0 09/16/2022   INSULIN 9.7 04/24/2020   Lab Results  Component Value Date   TSH 3.170 09/16/2022   CBC    Component Value Date/Time   WBC 7.0 08/25/2022 1720   WBC 12.0 (H) 05/06/2011 0650   RBC 4.34 08/25/2022 1720   RBC 3.28 (L) 05/06/2011 0650   HGB 13.7 08/25/2022 1720   HCT 39.5 08/25/2022 1720   PLT 213 08/25/2022 1720   MCV 91 08/25/2022 1720   MCH 31.6 08/25/2022 1720   MCH 32.3 05/06/2011 0650   MCHC 34.7 08/25/2022 1720   MCHC 33.1 05/06/2011 0650   RDW 12.2 08/25/2022 1720   Iron Studies No results found for: "IRON", "TIBC", "FERRITIN", "IRONPCTSAT" Lipid Panel     Component Value Date/Time   CHOL 213 (H) 08/25/2022 1720   TRIG 182 (H) 08/25/2022 1720   HDL 51 08/25/2022 1720   CHOLHDL 4.2 08/25/2022 1720   LDLCALC 130 (H) 08/25/2022 1720   Hepatic Function Panel     Component Value Date/Time   PROT 6.5 08/25/2022 1720  ALBUMIN 4.6 08/25/2022 1720   AST 24 08/25/2022 1720   ALT 28 08/25/2022 1720   ALKPHOS 102 08/25/2022 1720   BILITOT 0.4 08/25/2022 1720   BILIDIR 0.06 09/01/2018 1548      Component Value Date/Time   TSH 3.170 09/16/2022 0822   Nutritional Lab Results  Component Value Date   VD25OH 26.3 (L) 08/25/2022   VD25OH 41.5 01/29/2022   VD25OH 24.4 (L) 11/06/2020     ASSESSMENT AND PLAN  TREATMENT PLAN FOR OBESITY:  Recommended Dietary Goals  Virginia [name] is currently in the action stage of change. As such, her goal is to continue weight management plan. She has agreed to the Category 2 Plan.  Behavioral Intervention  We discussed the following Behavioral Modification Strategies today: increasing lean protein intake, increasing vegetables, increase water  intake, work on meal planning and easy cooking plans, and think about ways to increase physical activity.  Additional resources provided today: NA  Recommended Physical Activity Goals  Virginia [name] has been advised to work up to 150 minutes of moderate intensity aerobic activity a week and strengthening exercises 2-3 times per week for cardiovascular health, weight loss maintenance and preservation of muscle mass.   She has agreed to increase physical activity in their day and reduce sedentary time (increase NEAT).  and Will continue regular aerobic exercise 30 minutes, 3 times per week. Chosen activity walking.   Pharmacotherapy We discussed various medication options to help Virginia [name] with her weight loss efforts and we both agreed to none.  ASSOCIATED CONDITIONS ADDRESSED TODAY  Vitamin D deficiency Assessment & Plan: Taking prescription vitamin D 50,000 IU once weekly per PCP Recheck vitamin D level today with a target goal 50-70  Last vitamin D Lab Results  Component Value Date   VD25OH 26.3 (L) 08/25/2022     Orders: -     VITAMIN D 25 Hydroxy (Vit-D Deficiency, Fractures)  Obesity,current BMI 31.1  Low vitamin B12 level Assessment & Plan: Last B12 level 330 in October 2023 Energy level remains low Started a B complex vitamin 2 months ago Recheck B12 level today  Orders: -     Vitamin B12  Generalized obesity Assessment & Plan: Improving Net weight loss 12 pounds in 3 months which is a 6.4% total body weight loss on prescribed meal plan adding in more consistent walking.  We set a goal for 30 minutes 3 or more days a week.  She is welcome to check out [facility] well for additional exercise options.  Reviewed bioimpedance results.  Still losing lean muscle mass.       No follow-ups on file.Marland Kitchen She was informed of the importance of frequent follow up visits to maximize her success with intensive lifestyle modifications for her multiple health  conditions.   ATTESTASTION STATEMENTS:  Reviewed by clinician on day of visit: allergies, medications, problem list, medical history, surgical history, family history, social history, and previous encounter notes.   Time spent on visit including pre-visit chart review and post-visit care and charting was 30 minutes.    Dell Ponto, DO

## 2022-12-29 NOTE — Assessment & Plan Note (Signed)
Taking prescription vitamin D 50,000 IU once weekly per PCP Recheck vitamin D level today with a target goal 50-70  Last vitamin D Lab Results  Component Value Date   VD25OH 26.3 (L) 08/25/2022

## 2022-12-29 NOTE — Assessment & Plan Note (Signed)
Last B12 level 330 in October 2023 Energy level remains low Started a B complex vitamin 2 months ago Recheck B12 level today

## 2022-12-30 LAB — VITAMIN D 25 HYDROXY (VIT D DEFICIENCY, FRACTURES): Vit D, 25-Hydroxy: 43.2 ng/mL (ref 30.0–100.0)

## 2022-12-30 LAB — VITAMIN B12: Vitamin B-12: 817 pg/mL (ref 232–1245)

## 2022-12-31 ENCOUNTER — Ambulatory Visit: Payer: Medicare Other | Admitting: Psychology

## 2022-12-31 DIAGNOSIS — F324 Major depressive disorder, single episode, in partial remission: Secondary | ICD-10-CM

## 2022-12-31 DIAGNOSIS — F909 Attention-deficit hyperactivity disorder, unspecified type: Secondary | ICD-10-CM

## 2022-12-31 NOTE — Progress Notes (Signed)
[facility] Counselor/Therapist Progress Note  Patient ID: Virginia [name], MRN: [id number]   Date: 12/31/22  Time Spent: 2:02 pm -  2:43 pm :   41 Minutes  Treatment Type: Individual Therapy.  Reported Symptoms: depression and anxiety.   Mental Status Exam: Appearance:  Well Groomed     Behavior: Appropriate  Motor: Normal  Speech/Language:  Clear and Coherent  Affect: Congruent  Mood: dysthymic  Thought process: normal  Thought content:   WNL  Sensory/Perceptual disturbances:   WNL  Orientation: oriented to person, place, time/date, and situation  Attention: Good  Concentration: Good  Memory: WNL  Fund of knowledge:  Good  Insight:   Good  Judgment:  Good  Impulse Control: Good   Risk Assessment: Danger to Self:  No Self-injurious Behavior: No Danger to Others: No Duty to Warn:no Physical Aggression / Violence:No  Access to Firearms a concern: No  Gang Involvement:No   Subjective:   Virginia [name] participated from home, via video and consented to treatment. Therapist participated from home office. We met online due to [place] pandemic. Virginia [name] reviewed the events of the past week. She noted that her motivation "sucks". She noted "not wanting to do anything". She noted having tasks to complete like organizing her storage unit or cleaning her car.  She noted uncertainty regarding her ADHD medication being effective. She noted feeling depressed but could not identify a cause. She noted feeling better around 3 weeks ago. She noted a lack of motivation for tasks. She noted limited electronic's use in the morning, sleeping well, and eating healthfully.  Psycho-education regarding ADHD was provided and therapist encouraged Virginia [name] to employ tools such as music or audio-books to increase enjoyment of menial tasks and working on identifying additional healthy activities that could improve her engagement, as well. Therapist encouraged Virginia [name] to focus on self-care, reframing  goals in relation to unenjoyable tasks, and following up with her psychiatric provider to discuss mood and ADHD symptoms. Virginia [name] was engaged and motivated and expressed commitment towards our goals. Therapist praise Virginia [name] and provided supportive therapy. A follow-up was scheduled for continued treatment.   Interventions: BA and CBT  Diagnosis:  Major depressive disorder with single episode, in partial remission ([place])  Attention deficit hyperactivity disorder (ADHD), unspecified ADHD type   Treatment Plan:  Client Abilities/Strengths Virginia [name] is forthcoming and motivated for change.   Support System: Family and friends.   Client Treatment Preferences Outpatient therapy.   Client Statement of Needs Virginia [name] would like to "why I let people push my buttons", "how to deal with people better", being more patient, how to communicate better, setting boundaries for self and others, manage symptoms, processing past events, increasing confidence, defining healthy relationship, process relationship with father, improve self-care (healthful eating, exercise),    Treatment Level Weekly  Symptoms  GAD: Feeling nervious, difficulty managing worry, worrying about about different things, trouble relaxing, restlessness, irritability, feeling afraid something awful might happen.  (Status: maintained) Depression: Loss of interest, feeling down, fluctuating sleep, poor appetite, overeating, weight-gain, feeling bad about self, difficulty concentrating.           (Status: maintained)  Goals:   Virginia [name] experiences symptoms of depression and anxiety.    Target Date: 08/11/23 Frequency: Weekly  Progress: 0 Modality: individual    Therapist will provide referrals for additional resources as appropriate.  Therapist will provide psycho-education regarding Virginia [name]'s diagnosis and corresponding treatment approaches and interventions. Licensed Clinical Social Worker, [place], LCSW will support the patient's ability  to achieve the goals identified. will employ CBT, BA, Problem-solving, Solution Focused, Mindfulness,  coping skills, & other evidenced-based practices will be used to promote progress towards healthy functioning to help manage decrease symptoms associated with her diagnosis.   Reduce overall level, frequency, and intensity of the feelings of depression, anxiety and panic evidenced by decreased overall symptoms from 6 to 7 days/week to 0 to 1 days/week per client report for at least 3 consecutive months. Verbally express understanding of the relationship between feelings of depression, anxiety and their impact on thinking patterns and behaviors. Verbalize an understanding of the role that distorted thinking plays in creating fears, excessive worry, and ruminations.    Virginia [name] participated in the creation of the treatment plan)  Buena Irish, LCSW

## 2023-01-06 ENCOUNTER — Telehealth: Payer: Medicare Other | Admitting: Physician Assistant

## 2023-01-06 DIAGNOSIS — U071 COVID-19: Secondary | ICD-10-CM

## 2023-01-07 ENCOUNTER — Telehealth: Payer: Medicare Other | Admitting: Family Medicine

## 2023-01-07 DIAGNOSIS — U071 COVID-19: Secondary | ICD-10-CM

## 2023-01-07 MED ORDER — BENZONATATE 100 MG PO CAPS: 100.0000 mg | ORAL_CAPSULE | Freq: Three times a day (TID) | ORAL | 0 refills | Status: DC | PRN

## 2023-01-07 MED ORDER — MOLNUPIRAVIR EUA 200MG CAPSULE: 4.0000 | ORAL_CAPSULE | Freq: Two times a day (BID) | ORAL | 0 refills | Status: AC

## 2023-01-07 NOTE — Progress Notes (Signed)
Virtual Visit Consent   Virginia [name], you are scheduled for a virtual visit with a [facility] provider today. Just as with appointments in the office, your consent must be obtained to participate. Your consent will be active for this visit and any virtual visit you may have with one of our providers in the next 365 days. If you have a MyChart account, a copy of this consent can be sent to you electronically.  As this is a virtual visit, video technology does not allow for your provider to perform a traditional examination. This may limit your provider's ability to fully assess your condition. If your provider identifies any concerns that need to be evaluated in person or the need to arrange testing (such as labs, EKG, etc.), we will make arrangements to do so. Although advances in technology are sophisticated, we cannot ensure that it will always work on either your end or our end. If the connection with a video visit is poor, the visit may have to be switched to a telephone visit. With either a video or telephone visit, we are not always able to ensure that we have a secure connection.  By engaging in this virtual visit, you consent to the provision of healthcare and authorize for your insurance to be billed (if applicable) for the services provided during this visit. Depending on your insurance coverage, you may receive a charge related to this service.  I need to obtain your verbal consent now. Are you willing to proceed with your visit today? Virginia [name] has provided verbal consent on 01/07/2023 for a virtual visit (video or telephone). Virginia Mayo, NP  Date: 01/07/2023 10:28 AM  Virtual Visit via Video Note   I, Virginia [name], connected with  Virginia [name]  ([id number], Feb 26, 1961) on 01/07/23 at 10:30 AM EST by a video-enabled telemedicine application and verified that I am speaking with the correct person using two identifiers.  Location: Patient: Virtual Visit Location Patient:  Home Provider: Virtual Visit Location Provider: Home Office   I discussed the limitations of evaluation and management by telemedicine and the availability of in person appointments. The patient expressed understanding and agreed to proceed.    History of Present Illness: Virginia [name] is a 62 y.o. who identifies as a female who was assigned female at birth, and is being seen today for covid 19 + Onset was two days ago- sore throat. Just tested positive yesterday. Associated symptoms are sore throat, scratchy throat, congestion- sinus pressure headache, chest burning from coughing. Reports fever 100 and chills- none today yet. Modifying factors are took ibuprofen only- helped headache.  Denies chest pain, shortness of breath, fevers, chills  Exposure to sick contacts- unknown- reports father had covid last month COVID test: + Vaccines: covid vaccines- no booster, flu  Problems:  Patient Active Problem List   Diagnosis Date Noted   Generalized obesity 12/29/2022   Pain in both knees 10/22/2022   Low vitamin B12 level 10/22/2022   Seasonal affective disorder ([place]) 10/22/2022   Osteoarthritis of both knees 09/30/2022   Other fatigue 09/16/2022   SOBOE (shortness of breath on exertion) 09/16/2022   Recurrent major depressive disorder ([place]) 09/16/2022   Osteoarthritis of left knee 08/31/2022   Other hyperlipidemia 08/31/2022   Attention deficit hyperactivity disorder (ADHD) 08/12/2022   Chest pain 10/06/2017   Encntr for general adult medical exam w/o abnormal findings 10/06/2017   History of colonic polyps 10/06/2017   Lumbago with sciatica, left side  10/06/2017   Major depressive disorder with single episode, in partial remission ([place]) 10/06/2017   Other abnormal glucose 10/06/2017   Vitamin D deficiency 10/06/2017   Body mass index (bmi) 33.0-33.9, adult 05/21/2017    Allergies:  Allergies  Allergen Reactions   Doxycycline Itching   Penicillins Hives and Swelling     SWELLING OF TONGUE SWELLING OF JOINTS   Rosuvastatin Other (See Comments)    MUSCLE ACHES   Medications:  Current Outpatient Medications:    albuterol (VENTOLIN HFA) 108 (90 Base) MCG/ACT inhaler, INHALE 2 PUFFS INTO THE LUNGS EVERY 6 HOURS AS NEEDED FOR WHEEZING OR SHORTNESS OF BREATH, Disp: 6.7 g, Rfl: 1   atomoxetine (STRATTERA) 25 MG capsule, Take 50 mg by mouth daily., Disp: , Rfl:    atorvastatin (LIPITOR) 40 MG tablet, TAKE 1 TABLET BY MOUTH DAILY ON MONDAY THROUGH FRIDAY, Disp: 75 tablet, Rfl: 0   b complex vitamins capsule, Take 1 capsule by mouth daily., Disp: , Rfl:    Clindamycin Phosphate foam, Apply 1 application. topically 2 (two) times daily., Disp: 50 g, Rfl: 1   desvenlafaxine (PRISTIQ) 100 MG 24 hr tablet, Take 1 tablet by mouth daily., Disp: , Rfl:    Vitamin D, Ergocalciferol, (DRISDOL) 1.25 MG (50000 UNIT) CAPS capsule, TAKE ONE CAPSULE BY MOUTH EVERY 7 DAYS, Disp: 15 capsule, Rfl: 3   zolpidem (AMBIEN) 10 MG tablet, Take by mouth., Disp: , Rfl:   Observations/Objective: Patient is well-developed, well-nourished in no acute distress.  Resting comfortably  at home.  Head is normocephalic, atraumatic.  No labored breathing.  Speech is clear and coherent with logical content.  Patient is alert and oriented at baseline.    Assessment and Plan: 1. COVID-19  - molnupiravir EUA (LAGEVRIO) 200 mg CAPS capsule; Take 4 capsules (800 mg total) by mouth 2 (two) times daily for 5 days.  Dispense: 40 capsule; Refill: 0  - Continue OTC symptomatic management of choice - Will send OTC vitamins and supplement information through AVS - Take as  prescribed - Push fluids - Rest as needed - Discussed return precautions and when to seek in-person evaluation, sent via AVS as well   Reviewed side effects, risks and benefits of medication.    Patient acknowledged agreement and understanding of the plan.   Past Medical, Surgical, Social History, Allergies, and Medications have  been Reviewed.   Follow Up Instructions: I discussed the assessment and treatment plan with the patient. The patient was provided an opportunity to ask questions and all were answered. The patient agreed with the plan and demonstrated an understanding of the instructions.  A copy of instructions were sent to the patient via MyChart unless otherwise noted below.    The patient was advised to call back or seek an in-person evaluation if the symptoms worsen or if the condition fails to improve as anticipated.  Time:  I spent 10 minutes with the patient via telehealth technology discussing the above problems/concerns.    Virginia Mayo, NP

## 2023-01-07 NOTE — Patient Instructions (Signed)
Virginia [name], thank you for joining Perlie Mayo, NP for today's virtual visit.  While this provider is not your primary care provider (PCP), if your PCP is located in our provider database this encounter information will be shared with them immediately following your visit.   [facility] account gives you access to today's visit and all your visits, tests, and labs performed at [facility] " click here if you don't have a [facility] account or go to [email or web address]  Consent: (Patient) Virginia [name] provided verbal consent for this virtual visit at the beginning of the encounter.  Current Medications:  Current Outpatient Medications:    benzonatate (TESSALON) 100 MG capsule, Take 1 capsule (100 mg total) by mouth 3 (three) times daily as needed for cough., Disp: 30 capsule, Rfl: 0   molnupiravir EUA (LAGEVRIO) 200 mg CAPS capsule, Take 4 capsules (800 mg total) by mouth 2 (two) times daily for 5 days., Disp: 40 capsule, Rfl: 0   albuterol (VENTOLIN HFA) 108 (90 Base) MCG/ACT inhaler, INHALE 2 PUFFS INTO THE LUNGS EVERY 6 HOURS AS NEEDED FOR WHEEZING OR SHORTNESS OF BREATH, Disp: 6.7 g, Rfl: 1   atomoxetine (STRATTERA) 25 MG capsule, Take 50 mg by mouth daily., Disp: , Rfl:    atorvastatin (LIPITOR) 40 MG tablet, TAKE 1 TABLET BY MOUTH DAILY ON MONDAY THROUGH FRIDAY, Disp: 75 tablet, Rfl: 0   b complex vitamins capsule, Take 1 capsule by mouth daily., Disp: , Rfl:    Clindamycin Phosphate foam, Apply 1 application. topically 2 (two) times daily., Disp: 50 g, Rfl: 1   desvenlafaxine (PRISTIQ) 100 MG 24 hr tablet, Take 1 tablet by mouth daily., Disp: , Rfl:    Vitamin D, Ergocalciferol, (DRISDOL) 1.25 MG (50000 UNIT) CAPS capsule, TAKE ONE CAPSULE BY MOUTH EVERY 7 DAYS, Disp: 15 capsule, Rfl: 3   zolpidem (AMBIEN) 10 MG tablet, Take by mouth., Disp: , Rfl:    Medications ordered in this encounter:  Meds ordered this encounter  Medications    molnupiravir EUA (LAGEVRIO) 200 mg CAPS capsule    Sig: Take 4 capsules (800 mg total) by mouth 2 (two) times daily for 5 days.    Dispense:  40 capsule    Refill:  0    Order Specific Question:   Supervising Provider    Answer:   Chase Picket [id number]   benzonatate (TESSALON) 100 MG capsule    Sig: Take 1 capsule (100 mg total) by mouth 3 (three) times daily as needed for cough.    Dispense:  30 capsule    Refill:  0    Order Specific Question:   Supervising Provider    Answer:   Chase Picket [id number]     *If you need refills on other medications prior to your next appointment, please contact your pharmacy*  Follow-Up: Call back or seek an in-person evaluation if the symptoms worsen or if the condition fails to improve as anticipated.  [place] [phone]  Care Instructions:  To keep from spreading the disease you should: Stay home and limit contact with other people as much as possible. Wash your hands frequently. Cover your coughs and sneezes with a tissue, and throw used tissues in the trash.   Clean and disinfect frequently touched surfaces and objects.     Take care of yourself by: Staying home Resting Drinking fluids Take fever-reducing medications (Tylenol/Acetaminophen and Ibuprofen)   For more information on the disease  go to the Centers for Disease Control and Prevention website        Can take to lessen severity: Vit C 571m twice daily Vit D3 1000-2000 IU daily Optional: Famotidine 239mdaily Also can add tylenol/ibuprofen as needed for fevers and body aches May add Mucinex or Mucinex DM as needed for cough/congestion     10 Things You Can Do to Manage Your COVID-19 Symptoms at Home If you have possible or confirmed COVID-19: Stay home except to get medical care. Monitor your symptoms carefully. If your symptoms get worse, call your healthcare provider immediately. Get rest and stay hydrated. If you have a medical  appointment, call the healthcare provider ahead of time and tell them that you have or may have COVID-19. For medical emergencies, call 911 and notify the dispatch personnel that you have or may have COVID-19. Cover your cough and sneezes with a tissue or use the inside of your elbow. Wash your hands often with soap and water for at least 20 seconds or clean your hands with an alcohol-based hand sanitizer that contains at least 60% alcohol. As much as possible, stay in a specific room and away from other people in your home. Also, you should use a separate bathroom, if available. If you need to be around other people in or outside of the home, wear a mask. Avoid sharing personal items with other people in your household, like dishes, towels, and bedding. Clean all surfaces that are touched often, like counters, tabletops, and doorknobs. Use household cleaning sprays or wipes according to the label instructions. [email or web address]/16/2021 This information is not intended to replace advice given to you by your health care provider. Make sure you discuss any questions you have with your health care provider. Document Revised: 09/16/2020 Document Reviewed: 09/16/2020 Elsevier Patient Education  20[place]  Isolation Instructions: You are to isolate at home for 5 days from onset of your symptoms. If you must be around other household members who do not have symptoms, you need to make sure that both you and the family members are masking consistently with a high-quality mask.  After day 5 of isolation, if you have had no fever within 24 hours and you are feeling better, you can end isolation but need to mask for an additional 5 days.  After day 5 if you have a fever or are having significant symptoms, please isolate for full 10 days.  If you note any worsening of symptoms despite treatment, please seek an in-person evaluation ASAP. If you note any significant shortness of breath or any  chest pain, please seek ER evaluation. Please do not delay care!   COVID-19: What to Do if You Are Sick If you test positive and are an older adult or someone who is at high risk of getting very sick from COVID-19, treatment may be available. Contact a healthcare provider right away after a positive test to determine if you are eligible, even if your symptoms are mild right now. You can also visit a Test to Treat location and, if eligible, receive a prescription from a provider. Don't delay: Treatment must be started within the first few days to be effective. If you have a fever, cough, or other symptoms, you might have COVID-19. Most people have mild illness and are able to recover at home. If you are sick: Keep track of your symptoms. If you have an emergency warning sign (including trouble breathing), call 911. Steps to help prevent the spread  of COVID-19 if you are sick If you are sick with COVID-19 or think you might have COVID-19, follow the steps below to care for yourself and to help protect other people in your home and community. Stay home except to get medical care Stay home. Most people with COVID-19 have mild illness and can recover at home without medical care. Do not leave your home, except to get medical care. Do not visit public areas and do not go to places where you are unable to wear a mask. Take care of yourself. Get rest and stay hydrated. Take over-the-counter medicines, such as acetaminophen, to help you feel better. Stay in touch with your doctor. Call before you get medical care. Be sure to get care if you have trouble breathing, or have any other emergency warning signs, or if you think it is an emergency. Avoid public transportation, ride-sharing, or taxis if possible. Get tested If you have symptoms of COVID-19, get tested. While waiting for test results, stay away from others, including staying apart from those living in your household. Get tested as soon as possible  after your symptoms start. Treatments may be available for people with COVID-19 who are at risk for becoming very sick. Don't delay: Treatment must be started early to be effective--some treatments must begin within 5 days of your first symptoms. Contact your healthcare provider right away if your test result is positive to determine if you are eligible. Self-tests are one of several options for testing for the virus that causes COVID-19 and may be more convenient than laboratory-based tests and point-of-care tests. Ask your healthcare provider or your local health department if you need help interpreting your test results. You can visit your state, tribal, local, and territorial health department's website to look for the latest local information on testing sites. Separate yourself from other people As much as possible, stay in a specific room and away from other people and pets in your home. If possible, you should use a separate bathroom. If you need to be around other people or animals in or outside of the home, wear a well-fitting mask. Tell your close contacts that they may have been exposed to COVID-19. An infected person can spread COVID-19 starting 48 hours (or 2 days) before the person has any symptoms or tests positive. By letting your close contacts know they may have been exposed to COVID-19, you are helping to protect everyone. See COVID-19 and Animals if you have questions about pets. If you are diagnosed with COVID-19, someone from the health department may call you. Answer the call to slow the spread. Monitor your symptoms Symptoms of COVID-19 include fever, cough, or other symptoms. Follow care instructions from your healthcare provider and local health department. Your local health authorities may give instructions on checking your symptoms and reporting information. When to seek emergency medical attention Look for emergency warning signs* for COVID-19. If someone is showing any of  these signs, seek emergency medical care immediately: Trouble breathing Persistent pain or pressure in the chest New confusion Inability to wake or stay awake Pale, gray, or blue-colored skin, lips, or nail beds, depending on skin tone *This list is not all possible symptoms. Please call your medical provider for any other symptoms that are severe or concerning to you. Call 911 or call ahead to your local emergency facility: Notify the operator that you are seeking care for someone who has or may have COVID-19. Call ahead before visiting your doctor Call ahead. Many medical  visits for routine care are being postponed or done by phone or telemedicine. If you have a medical appointment that cannot be postponed, call your doctor's office, and tell them you have or may have COVID-19. This will help the office protect themselves and other patients. If you are sick, wear a well-fitting mask You should wear a mask if you must be around other people or animals, including pets (even at home). Wear a mask with the best fit, protection, and comfort for you. You don't need to wear the mask if you are alone. If you can't put on a mask (because of trouble breathing, for example), cover your coughs and sneezes in some other way. Try to stay at least 6 feet away from other people. This will help protect the people around you. Masks should not be placed on young children under age 32 years, anyone who has trouble breathing, or anyone who is not able to remove the mask without help. Cover your coughs and sneezes Cover your mouth and nose with a tissue when you cough or sneeze. Throw away used tissues in a lined trash can. Immediately wash your hands with soap and water for at least 20 seconds. If soap and water are not available, clean your hands with an alcohol-based hand sanitizer that contains at least 60% alcohol. Clean your hands often Wash your hands often with soap and water for at least 20 seconds. This is  especially important after blowing your nose, coughing, or sneezing; going to the bathroom; and before eating or preparing food. Use hand sanitizer if soap and water are not available. Use an alcohol-based hand sanitizer with at least 60% alcohol, covering all surfaces of your hands and rubbing them together until they feel dry. Soap and water are the best option, especially if hands are visibly dirty. Avoid touching your eyes, nose, and mouth with unwashed hands. Handwashing Tips Avoid sharing personal household items Do not share dishes, drinking glasses, cups, eating utensils, towels, or bedding with other people in your home. Wash these items thoroughly after using them with soap and water or put in the dishwasher. Clean surfaces in your home regularly Clean and disinfect high-touch surfaces (for example, doorknobs, tables, handles, light switches, and countertops) in your "sick room" and bathroom. In shared spaces, you should clean and disinfect surfaces and items after each use by the person who is ill. If you are sick and cannot clean, a caregiver or other person should only clean and disinfect the area around you (such as your bedroom and bathroom) on an as needed basis. Your caregiver/other person should wait as long as possible (at least several hours) and wear a mask before entering, cleaning, and disinfecting shared spaces that you use. Clean and disinfect areas that may have blood, stool, or body fluids on them. Use household cleaners and disinfectants. Clean visible dirty surfaces with household cleaners containing soap or detergent. Then, use a household disinfectant. Use a product from [employer] List N: Disinfectants for Coronavirus ([id number]). Be sure to follow the instructions on the label to ensure safe and effective use of the product. Many products recommend keeping the surface wet with a disinfectant for a certain period of time (look at "contact time" on the product label). You may  also need to wear personal protective equipment, such as gloves, depending on the directions on the product label. Immediately after disinfecting, wash your hands with soap and water for 20 seconds. For completed guidance on cleaning and disinfecting your home,  visit Complete Disinfection Guidance. Take steps to improve ventilation at home Improve ventilation (air flow) at home to help prevent from spreading COVID-19 to other people in your household. Clear out COVID-19 virus particles in the air by opening windows, using air filters, and turning on fans in your home. Use this interactive tool to learn how to improve air flow in your home. When you can be around others after being sick with COVID-19 Deciding when you can be around others is different for different situations. Find out when you can safely end home isolation. For any additional questions about your care, contact your healthcare provider or state or local health department. 02/04/2021 Content source: [facility] for Immunization and Respiratory Diseases ([facility]), Division of Viral Diseases This information is not intended to replace advice given to you by your health care provider. Make sure you discuss any questions you have with your health care provider. Document Revised: 03/20/2021 Document Reviewed: 03/20/2021 Elsevier Patient Education  2022 [employer].       If you have been instructed to have an in-person evaluation today at a local Urgent Care facility, please use the link below. It will take you to a list of all of our available [facility], including address, phone number and hours of operation. Please do not delay care.  [facility]  If you or a family member do not have a primary care provider, use the link below to schedule a visit and establish care. When you choose a [facility] primary care physician or advanced practice provider, you gain a long-term partner in health. Find a  Primary Care Provider  Learn more about [facility]'s in-office and virtual care options: [facility]

## 2023-01-07 NOTE — Progress Notes (Signed)
   Thank you for the details you included in the comment boxes. Those details are very helpful in determining the best course of treatment for you and help [place] to provide the best care.Because you are COVID positive and giving health history and need for discussion of antiviral medications (which we cannot prescribe via e-visit alone), we recommend that you convert this visit to a video visit in order for the provider to better assess what is going on.  The provider will be able to give you a more accurate diagnosis and treatment plan if we can more freely discuss your symptoms and with the addition of a virtual examination.   If you convert to a video visit, we will bill your insurance (similar to an office visit) and you will not be charged for this e-Visit. You will be able to stay at home and speak with the first available [facility] advanced practice provider. The link to do a video visit is in the drop down Menu tab of your Welcome screen in [place].

## 2023-01-19 ENCOUNTER — Encounter: Payer: Self-pay | Admitting: Nurse Practitioner

## 2023-01-19 ENCOUNTER — Ambulatory Visit (INDEPENDENT_AMBULATORY_CARE_PROVIDER_SITE_OTHER): Payer: Medicare Other | Admitting: Nurse Practitioner

## 2023-01-19 VITALS — BP 110/70 | HR 85 | Temp 97.6°F | Ht 63.0 in | Wt 180.6 lb

## 2023-01-19 DIAGNOSIS — R7309 Other abnormal glucose: Secondary | ICD-10-CM

## 2023-01-19 DIAGNOSIS — E559 Vitamin D deficiency, unspecified: Secondary | ICD-10-CM

## 2023-01-19 DIAGNOSIS — E7800 Pure hypercholesterolemia, unspecified: Secondary | ICD-10-CM | POA: Diagnosis not present

## 2023-01-19 DIAGNOSIS — R141 Gas pain: Secondary | ICD-10-CM | POA: Insufficient documentation

## 2023-01-19 DIAGNOSIS — Z0000 Encounter for general adult medical examination without abnormal findings: Secondary | ICD-10-CM

## 2023-01-19 DIAGNOSIS — K219 Gastro-esophageal reflux disease without esophagitis: Secondary | ICD-10-CM | POA: Insufficient documentation

## 2023-01-19 DIAGNOSIS — R42 Dizziness and giddiness: Secondary | ICD-10-CM | POA: Diagnosis not present

## 2023-01-19 DIAGNOSIS — Z83719 Family history of colon polyps, unspecified: Secondary | ICD-10-CM | POA: Insufficient documentation

## 2023-01-19 DIAGNOSIS — R635 Abnormal weight gain: Secondary | ICD-10-CM | POA: Insufficient documentation

## 2023-01-19 DIAGNOSIS — Z8616 Personal history of COVID-19: Secondary | ICD-10-CM | POA: Diagnosis not present

## 2023-01-19 DIAGNOSIS — Z0001 Encounter for general adult medical examination with abnormal findings: Secondary | ICD-10-CM | POA: Diagnosis not present

## 2023-01-19 DIAGNOSIS — F331 Major depressive disorder, recurrent, moderate: Secondary | ICD-10-CM

## 2023-01-19 DIAGNOSIS — F909 Attention-deficit hyperactivity disorder, unspecified type: Secondary | ICD-10-CM

## 2023-01-19 MED ORDER — ATORVASTATIN CALCIUM 40 MG PO TABS: ORAL_TABLET | ORAL | 0 refills | Status: DC

## 2023-01-19 MED ORDER — VITAMIN D (ERGOCALCIFEROL) 1.25 MG (50000 UNIT) PO CAPS: ORAL_CAPSULE | ORAL | 3 refills | Status: AC

## 2023-01-19 NOTE — Progress Notes (Signed)
Barnet Glasgow Martin,acting as a Education administrator for [name], FNP.,have documented all relevant documentation on the behalf of [name], FNP,as directed by  [name], FNP while in the presence of [name], [place].   Subjective:     Patient ID: Virginia [name] , female    DOB: [birth year] , 62 y.o.   MRN: [id number]   Chief Complaint  Patient presents with   Annual Exam    HPI  She is here today for a full physical exam. Patient  states compliance with medications. She gets light headed when standing up and when reaching up. Patient states this has been happening for about 6 months. She had her cervix and her uterus removed in 1997. 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   BP Readings from Last 3 Encounters: 01/19/23 : 120/80 12/29/22 : 100/69 11/24/22 : 103/71  Wt Readings from Last 3 Encounters: 01/19/23 : 180 lb 9.6 oz (81.9 kg) 12/29/22 : 175 lb (79.4 kg) 11/24/22 : 179 lb (81.2 kg)       Past Medical History:  Diagnosis Date   ADHD    Anxiety    Arthritis    Back pain    Chest pain, unspecified    Depression    Dermatitis    Fatty liver    GERD (gastroesophageal reflux disease)    HLD (hyperlipidemia)    no current med.   Insomnia    Joint pain    Loss of hair    Low back pain    Major depressive disorder, single episode, mild (HCC)    Personal history of colonic polyps    Shoulder impingement 06/2012   left   Vitamin D deficiency    Wears dentures    upper denture   Wears partial dentures    lower partial     Family History  Problem Relation Age of Onset   Hyperlipidemia Mother    COPD Mother    Heart disease Mother    Diabetes Mother    Depression Mother    Heart Problems Mother    Anxiety disorder Mother    Hypertension Father    Hypothyroidism Father    Heart attack Other    Hypertension Other    Diabetes Other    COPD Other      Current Outpatient Medications:    albuterol (VENTOLIN HFA) 108 (90 Base)  MCG/ACT inhaler, INHALE 2 PUFFS INTO THE LUNGS EVERY 6 HOURS AS NEEDED FOR WHEEZING OR SHORTNESS OF BREATH, Disp: 6.7 g, Rfl: 1   atomoxetine (STRATTERA) 25 MG capsule, Take 50 mg by mouth daily., Disp: , Rfl:    b complex vitamins capsule, Take 1 capsule by mouth daily., Disp: , Rfl:    Clindamycin Phosphate foam, Apply 1 application. topically 2 (two) times daily., Disp: 50 g, Rfl: 1   desvenlafaxine (PRISTIQ) 100 MG 24 hr tablet, Take 1 tablet by mouth daily., Disp: , Rfl:    zolpidem (AMBIEN) 10 MG tablet, Take by mouth., Disp: , Rfl:    atorvastatin (LIPITOR) 40 MG tablet, TAKE 1 TABLET BY MOUTH DAILY ON MONDAY THROUGH FRIDAY, Disp: 75 tablet, Rfl: 0   Vitamin D, Ergocalciferol, (DRISDOL) 1.25 MG (50000 UNIT) CAPS capsule, TAKE ONE CAPSULE BY MOUTH EVERY 7 DAYS, Disp: 15 capsule, Rfl: 3   Allergies  Allergen Reactions   Doxycycline Itching   Penicillins Hives and Swelling    SWELLING OF TONGUE SWELLING OF JOINTS   Rosuvastatin Other (  See Comments)    MUSCLE ACHES      The patient states she is status post hysterectomy for birth control.  No LMP recorded. Patient has had a hysterectomy.. Negative for Dysmenorrhea and Negative for Menorrhagia. Negative for: breast discharge, breast lump(s), breast pain and breast self exam. Associated symptoms include abnormal vaginal bleeding. Pertinent negatives include abnormal bleeding (hematology), anxiety, decreased libido, depression, difficulty falling sleep, dyspareunia, history of infertility, nocturia, sexual dysfunction, sleep disturbances, urinary incontinence, urinary urgency, vaginal discharge and vaginal itching. Diet regular.The patient states her exercise level is minimum  The patient's tobacco use is:  Social History   Tobacco Use  Smoking Status Former   Packs/day: 2.75   Years: 40.00   Additional pack years: 0.00   Total pack years: 110.00   Types: Cigarettes   Quit date: 2018   Years since quitting: 6.2  Smokeless Tobacco  Never  Tobacco Comments   quit smoking 02/2017   She has been exposed to passive smoke. The patient's alcohol use is:  Social History   Substance and Sexual Activity  Alcohol Use Yes   Comment: moderate, socially    Review of Systems  Constitutional: Negative.   HENT: Negative.    Eyes: Negative.   Respiratory:  Negative for cough and shortness of breath.   Cardiovascular: Negative.   Gastrointestinal: Negative.   Endocrine: Negative.   Genitourinary: Negative.   Musculoskeletal: Negative.   Skin: Negative.   Allergic/Immunologic: Negative.   Neurological: Negative.   Hematological: Negative.   Psychiatric/Behavioral: Negative.       Today's Vitals   01/19/23 1538 01/19/23 1633 01/19/23 1634 01/19/23 1635  BP: 120/80 110/80 120/80 110/70  Pulse: 72 88 83 85  Temp: 97.6 F (36.4 C)     TempSrc: Oral     Weight: 180 lb 9.6 oz (81.9 kg)     Height: 5\' 3"  (1.6 m)     PainSc: 0-No pain      Body mass index is 31.99 kg/m.  Wt Readings from Last 3 Encounters:  01/19/23 180 lb 9.6 oz (81.9 kg)  12/29/22 175 lb (79.4 kg)  11/24/22 179 lb (81.2 kg)    Objective:  Physical Exam Vitals reviewed.  Constitutional:      General: She is not in acute distress.    Appearance: Normal appearance. She is well-developed. She is obese.  HENT:     Head: Normocephalic and atraumatic.     Right Ear: Hearing, tympanic membrane, ear canal and external ear normal. There is no impacted cerumen.     Left Ear: Hearing, tympanic membrane, ear canal and external ear normal. There is no impacted cerumen.     Nose:     Comments: Deferred - masked    Mouth/Throat:     Comments: Deferred - masked Eyes:     General: Lids are normal.     Extraocular Movements: Extraocular movements intact.     Conjunctiva/sclera: Conjunctivae normal.     Pupils: Pupils are equal, round, and reactive to light.     Funduscopic exam:    Right eye: No papilledema.        Left eye: No papilledema.  Neck:      Thyroid: No thyroid mass.     Vascular: No carotid bruit.  Cardiovascular:     Rate and Rhythm: Normal rate and regular rhythm.     Pulses: Normal pulses.     Heart sounds: Normal heart sounds. No murmur heard. Pulmonary:     Effort:  Pulmonary effort is normal. No respiratory distress.     Breath sounds: Normal breath sounds. No wheezing.  Chest:     Chest wall: No mass.  Breasts:    Tanner Score is 5.     Right: Normal. No mass or tenderness.     Left: Normal. No mass or tenderness.     Comments: She has a tattoo on her right breast Abdominal:     General: Abdomen is flat. Bowel sounds are normal. There is no distension.     Palpations: Abdomen is soft.     Tenderness: There is no abdominal tenderness.  Genitourinary:    Rectum: Guaiac result negative.  Musculoskeletal:        General: No swelling. Normal range of motion.     Cervical back: Full passive range of motion without pain, normal range of motion and neck supple.     Right lower leg: No edema.     Left lower leg: No edema.  Lymphadenopathy:     Upper Body:     Right upper body: No supraclavicular, axillary or pectoral adenopathy.     Left upper body: No supraclavicular, axillary or pectoral adenopathy.  Skin:    General: Skin is warm and dry.     Capillary Refill: Capillary refill takes less than 2 seconds.     Coloration: Skin is not jaundiced.     Findings: No bruising.  Neurological:     General: No focal deficit present.     Mental Status: She is alert and oriented to person, place, and time.     Cranial Nerves: No cranial nerve deficit.     Sensory: No sensory deficit.  Psychiatric:        Mood and Affect: Mood normal.        Behavior: Behavior normal.        Thought Content: Thought content normal.        Judgment: Judgment normal.         Assessment And Plan:     1. Annual physical exam Behavior modifications discussed and diet history reviewed.   Pt will continue to exercise regularly and modify  diet with low GI, plant based foods and decrease intake of processed foods.  Recommend intake of daily multivitamin, Vitamin D, and calcium.  Recommend mammogram and colonoscopy for preventive screenings, as well as recommend immunizations that include influenza, TDAP, and Shingles  2. Pure hypercholesterolemia Comments: Cholesterol levels have been elevated and she is to take atorvastatin 5 days a week - CMP14+EGFR - Lipid panel - atorvastatin (LIPITOR) 40 MG tablet; TAKE 1 TABLET BY MOUTH DAILY ON MONDAY THROUGH FRIDAY  Dispense: 75 tablet; Refill: 0  3. Vitamin D deficiency Will check vitamin D level and supplement as needed.    Also encouraged to spend 15 minutes in the sun daily.  - VITAMIN D 25 Hydroxy (Vit-D Deficiency, Fractures) - Vitamin D, Ergocalciferol, (DRISDOL) 1.25 MG (50000 UNIT) CAPS capsule; TAKE ONE CAPSULE BY MOUTH EVERY 7 DAYS  Dispense: 15 capsule; Refill: 3  4. Lightheaded Comments: Encouraged to make sure staying well hydrated with water. Blood pressure is low normal.  Will check for low hemoglobin and thyroid levels.  - CBC - TSH  5. Other abnormal glucose Comments: Hgba1c is stable, continue focusing on healthy diet low in sugar and starches. - Hemoglobin A1c  6. Attention deficit hyperactivity disorder (ADHD), unspecified ADHD type Comments: Continue f/u with behavioral health.  7. Moderate episode of recurrent major depressive disorder ([place])  Comments: Continue f/u with Behavioral health and the counselor  8. History of COVID-19 Comments: Had 2 weeks ago and treated symptomatic. She is doing better since then.   Patient was given opportunity to ask questions. Patient verbalized understanding of the plan and was able to repeat key elements of the plan. All questions were answered to their satisfaction.   [name], FNP   I, [name], FNP, have reviewed all documentation for this visit. The documentation on 01/19/23 for the exam, diagnosis,  procedures, and orders are all accurate and complete.   THE PATIENT IS ENCOURAGED TO PRACTICE SOCIAL DISTANCING DUE TO THE COVID-19 PANDEMIC.

## 2023-01-20 LAB — CBC
Hematocrit: 41.4 % (ref 34.0–46.6)
Hemoglobin: 13.8 g/dL (ref 11.1–15.9)
MCH: 30.2 pg (ref 26.6–33.0)
MCHC: 33.3 g/dL (ref 31.5–35.7)
MCV: 91 fL (ref 79–97)
Platelets: 251 10*3/uL (ref 150–450)
RBC: 4.57 x10E6/uL (ref 3.77–5.28)
RDW: 12.6 % (ref 11.7–15.4)
WBC: 10.8 10*3/uL (ref 3.4–10.8)

## 2023-01-20 LAB — CMP14+EGFR
ALT: 34 IU/L — ABNORMAL HIGH (ref 0–32)
AST: 20 IU/L (ref 0–40)
Albumin/Globulin Ratio: 2 (ref 1.2–2.2)
Albumin: 4.5 g/dL (ref 3.9–4.9)
Alkaline Phosphatase: 112 IU/L (ref 44–121)
BUN/Creatinine Ratio: 33 — ABNORMAL HIGH (ref 12–28)
BUN: 27 mg/dL (ref 8–27)
Bilirubin Total: 0.4 mg/dL (ref 0.0–1.2)
CO2: 22 mmol/L (ref 20–29)
Calcium: 9.7 mg/dL (ref 8.7–10.3)
Chloride: 99 mmol/L (ref 96–106)
Creatinine, Ser: 0.82 mg/dL (ref 0.57–1.00)
Globulin, Total: 2.3 g/dL (ref 1.5–4.5)
Glucose: 94 mg/dL (ref 70–99)
Potassium: 5 mmol/L (ref 3.5–5.2)
Sodium: 138 mmol/L (ref 134–144)
Total Protein: 6.8 g/dL (ref 6.0–8.5)
eGFR: 81 mL/min/{1.73_m2} (ref >59)

## 2023-01-20 LAB — LIPID PANEL
Chol/HDL Ratio: 5 ratio — ABNORMAL HIGH (ref 0.0–4.4)
Cholesterol, Total: 213 mg/dL — ABNORMAL HIGH (ref 100–199)
HDL: 43 mg/dL (ref >39)
LDL Chol Calc (NIH): 131 mg/dL — ABNORMAL HIGH (ref 0–99)
Triglycerides: 220 mg/dL — ABNORMAL HIGH (ref 0–149)
VLDL Cholesterol Cal: 39 mg/dL (ref 5–40)

## 2023-01-20 LAB — VITAMIN D 25 HYDROXY (VIT D DEFICIENCY, FRACTURES): Vit D, 25-Hydroxy: 43.7 ng/mL (ref 30.0–100.0)

## 2023-01-20 LAB — TSH: TSH: 1.11 u[IU]/mL (ref 0.450–4.500)

## 2023-01-20 LAB — HEMOGLOBIN A1C
Est. average glucose Bld gHb Est-mCnc: 114 mg/dL
Hgb A1c MFr Bld: 5.6 % (ref 4.8–5.6)

## 2023-01-26 ENCOUNTER — Ambulatory Visit (INDEPENDENT_AMBULATORY_CARE_PROVIDER_SITE_OTHER): Payer: Medicare Other | Admitting: Family Medicine

## 2023-01-29 ENCOUNTER — Ambulatory Visit (INDEPENDENT_AMBULATORY_CARE_PROVIDER_SITE_OTHER): Payer: Medicare Other | Admitting: Psychology

## 2023-01-29 DIAGNOSIS — F324 Major depressive disorder, single episode, in partial remission: Secondary | ICD-10-CM | POA: Diagnosis not present

## 2023-01-29 DIAGNOSIS — F909 Attention-deficit hyperactivity disorder, unspecified type: Secondary | ICD-10-CM | POA: Diagnosis not present

## 2023-01-29 NOTE — Progress Notes (Signed)
[facility] Counselor/Therapist Progress Note  Patient ID: Virginia [name], MRN: [id number]   Date: 01/29/23  Time Spent: 1:34 pm -  2:05 pm :   31 Minutes  Treatment Type: Individual Therapy.  Reported Symptoms: depression and anxiety.   Mental Status Exam: Appearance:  Well Groomed     Behavior: Appropriate  Motor: Normal  Speech/Language:  Clear and Coherent  Affect: Congruent  Mood: dysthymic  Thought process: normal  Thought content:   WNL  Sensory/Perceptual disturbances:   WNL  Orientation: oriented to person, place, time/date, and situation  Attention: Good  Concentration: Good  Memory: WNL  Fund of knowledge:  Good  Insight:   Good  Judgment:  Good  Impulse Control: Good   Risk Assessment: Danger to Self:  No Self-injurious Behavior: No Danger to Others: No Duty to Warn:no Physical Aggression / Violence:No  Access to Firearms a concern: No  Gang Involvement:No   Subjective:   Virginia [name] participated from home, via video and consented to treatment. Therapist participated from home office. We met online due to [place] pandemic. Virginia [name] reviewed the events of the past week. Virginia [name] noted being busy and not having time to meet with her psychiatric provider but is slated for a mid-April appointment. She noted being stressed with her father who she labeled as a hypochondriac. She noted him often complaining about his health at 61 years old. She noted frustration regarding how he communicates. She noted her father being argumentative for the sake of argument. She noted a need for boundaries, communicating assertively, and minimize frustration's. She noted the need for a mini-mental status exam due to his possible cognitive changes. She noted worry that this might hurt his feelings but noted a need to give feedback to his doctor. She noted worry about his driving, as well, due to his age. We reviewed her coping regarding this worry and we worked on identifying ways  to address needs. She noted continued visits to healthy weight and wellness and noted doing well this, overall. She noted having a recent medical checkup and doing well, overall but noted awaiting response regarding kidney function and cholesterol. She noted working on simple tasks, in regards to preparation, and noted a need to take the next step of task engagement. She noted working on walking more consistently 2-3 times a week. She noted working on socializing and noted it being a "nice experience". We discussed being more intentional and creating a short daily list of tasks to complete and daily check-ins and planning for the next day. Therapist encouraged problem-solving as well for task completion.  Therapist praise Virginia [name] and provided supportive therapy. A follow-up was scheduled for continued treatment.   Interventions: BA and CBT  Diagnosis:  Major depressive disorder with single episode, in partial remission ([place])  Attention deficit hyperactivity disorder (ADHD), unspecified ADHD type   Treatment Plan:  Client Abilities/Strengths Virginia [name] is forthcoming and motivated for change.   Support System: Family and friends.   Client Treatment Preferences Outpatient therapy.   Client Statement of Needs Virginia [name] would like to "why I let people push my buttons", "how to deal with people better", being more patient, how to communicate better, setting boundaries for self and others, manage symptoms, processing past events, increasing confidence, defining healthy relationship, process relationship with father, improve self-care (healthful eating, exercise),    Treatment Level Weekly  Symptoms  GAD: Feeling nervious, difficulty managing worry, worrying about about different things, trouble relaxing, restlessness, irritability, feeling afraid something awful  might happen.  (Status: maintained) Depression: Loss of interest, feeling down, fluctuating sleep, poor appetite, overeating, weight-gain, feeling  bad about self, difficulty concentrating.           (Status: maintained)  Goals:   Virginia [name] experiences symptoms of depression and anxiety.    Target Date: 08/11/23 Frequency: Weekly  Progress: 0 Modality: individual    Therapist will provide referrals for additional resources as appropriate.  Therapist will provide psycho-education regarding Virginia [name]'s diagnosis and corresponding treatment approaches and interventions. Licensed Clinical Social Worker, [place], LCSW will support the patient's ability to achieve the goals identified. will employ CBT, BA, Problem-solving, Solution Focused, Mindfulness,  coping skills, & other evidenced-based practices will be used to promote progress towards healthy functioning to help manage decrease symptoms associated with her diagnosis.   Reduce overall level, frequency, and intensity of the feelings of depression, anxiety and panic evidenced by decreased overall symptoms from 6 to 7 days/week to 0 to 1 days/week per client report for at least 3 consecutive months. Verbally express understanding of the relationship between feelings of depression, anxiety and their impact on thinking patterns and behaviors. Verbalize an understanding of the role that distorted thinking plays in creating fears, excessive worry, and ruminations.    Virginia [name] participated in the creation of the treatment plan)  Buena Irish, LCSW

## 2023-01-31 ENCOUNTER — Encounter: Payer: Self-pay | Admitting: Nurse Practitioner

## 2023-02-23 ENCOUNTER — Ambulatory Visit (INDEPENDENT_AMBULATORY_CARE_PROVIDER_SITE_OTHER): Payer: Medicare Other | Admitting: Family Medicine

## 2023-02-23 ENCOUNTER — Encounter (INDEPENDENT_AMBULATORY_CARE_PROVIDER_SITE_OTHER): Payer: Self-pay

## 2023-03-12 ENCOUNTER — Ambulatory Visit (INDEPENDENT_AMBULATORY_CARE_PROVIDER_SITE_OTHER): Payer: Medicare Other | Admitting: Psychology

## 2023-03-12 DIAGNOSIS — F324 Major depressive disorder, single episode, in partial remission: Secondary | ICD-10-CM

## 2023-03-12 DIAGNOSIS — F909 Attention-deficit hyperactivity disorder, unspecified type: Secondary | ICD-10-CM

## 2023-03-12 NOTE — Progress Notes (Signed)
[facility] Counselor/Therapist Progress Note  Patient ID: SUKARI GRIST, MRN: [id number]   Date: 03/12/23  Time Spent: 1:02 pm -  1:28 pm :   26 Minutes  Treatment Type: Individual Therapy.  Reported Symptoms: depression and anxiety.   Mental Status Exam: Appearance:  Well Groomed     Behavior: Appropriate  Motor: Normal  Speech/Language:  Clear and Coherent  Affect: Congruent  Mood: dysthymic  Thought process: normal  Thought content:   WNL  Sensory/Perceptual disturbances:   WNL  Orientation: oriented to person, place, time/date, and situation  Attention: Good  Concentration: Good  Memory: WNL  Fund of knowledge:  Good  Insight:   Good  Judgment:  Good  Impulse Control: Good   Risk Assessment: Danger to Self:  No Self-injurious Behavior: No Danger to Others: No Duty to Warn:no Physical Aggression / Violence:No  Access to Firearms a concern: No  Gang Involvement:No   Subjective:   [name] participated from home, via video and consented to treatment. Therapist participated from home office. We met online due to COVID pandemic. Demoni reviewed the events of the past week. Sh met with her psychiatric provider and noted changed to her medication. She is prescribed Pristiq 100 mg, Strattera 60 mg qd, and Seroquel 50 mg qd. She noted reporting to her doctor that she "doesn't want to do anything and not motivated enough". She noted difficulty getting outside, completing tasks, or "straighten out the house". She noted uncertainty regarding the cause of this worsening shift in mood. She noted she noted this occurring once every ~2 months.She noted the possibility of mother's day affecting her mood; it will be 5 years since her mother's passing. She noted previously trying to stay busy during this time. She noted her intent to work on active plans during this time. She noted also feeling overextended due to aiding others with varying projects.She noted at times  having difficulty saying no. She noted difficulty asking for help and noted this being an "ongoing issues". She noted feelings of frustration regarding her elderly father's behavior. We worked on processing this. Therapist encouraged self-care, taking breaks, and spending time outside. Therapist praise Elizebeth and provided supportive therapy. A follow-up was scheduled for continued treatment.   Interventions: CBT  Diagnosis:  Major depressive disorder with single episode, in partial remission (HCC)  Attention deficit hyperactivity disorder (ADHD), unspecified ADHD type   Treatment Plan:  Client Abilities/Strengths Dezaria is forthcoming and motivated for change.   Support System: Family and friends.   Client Treatment Preferences Outpatient therapy.   Client Statement of Needs Rosine would like to "why I let people push my buttons", "how to deal with people better", being more patient, how to communicate better, setting boundaries for self and others, manage symptoms, processing past events, increasing confidence, defining healthy relationship, process relationship with father, improve self-care (healthful eating, exercise),    Treatment Level Weekly  Symptoms  GAD: Feeling nervious, difficulty managing worry, worrying about about different things, trouble relaxing, restlessness, irritability, feeling afraid something awful might happen.  (Status: maintained) Depression: Loss of interest, feeling down, fluctuating sleep, poor appetite, overeating, weight-gain, feeling bad about self, difficulty concentrating.           (Status: maintained)  Goals:   Jared experiences symptoms of depression and anxiety.    Target Date: 08/11/23 Frequency: Weekly  Progress: 0 Modality: individual    Therapist will provide referrals for additional resources as appropriate.  Therapist will provide psycho-education regarding Venora's  diagnosis and corresponding treatment approaches and  interventions. Licensed Clinical Social Worker, [place], LCSW will support the patient's ability to achieve the goals identified. will employ CBT, BA, Problem-solving, Solution Focused, Mindfulness,  coping skills, & other evidenced-based practices will be used to promote progress towards healthy functioning to help manage decrease symptoms associated with her diagnosis.   Reduce overall level, frequency, and intensity of the feelings of depression, anxiety and panic evidenced by decreased overall symptoms from 6 to 7 days/week to 0 to 1 days/week per client report for at least 3 consecutive months. Verbally express understanding of the relationship between feelings of depression, anxiety and their impact on thinking patterns and behaviors. Verbalize an understanding of the role that distorted thinking plays in creating fears, excessive worry, and ruminations.    [name] participated in the creation of the treatment plan)  Delight Ovens, LCSW

## 2023-04-02 ENCOUNTER — Ambulatory Visit (INDEPENDENT_AMBULATORY_CARE_PROVIDER_SITE_OTHER): Payer: Medicare Other | Admitting: Psychology

## 2023-04-02 DIAGNOSIS — F324 Major depressive disorder, single episode, in partial remission: Secondary | ICD-10-CM

## 2023-04-02 DIAGNOSIS — F909 Attention-deficit hyperactivity disorder, unspecified type: Secondary | ICD-10-CM

## 2023-04-02 NOTE — Progress Notes (Signed)
[facility] Counselor/Therapist Progress Note  Patient ID: Virginia [name], MRN: [id number]   Date: 04/02/23  Time Spent: 2:33 pm -  2:52 pm :  19 Minutes  Treatment Type: Individual Therapy.  Reported Symptoms: depression and anxiety.   Mental Status Exam: Appearance:  Well Groomed     Behavior: Appropriate  Motor: Normal  Speech/Language:  Clear and Coherent  Affect: Congruent  Mood: normal  Thought process: normal  Thought content:   WNL  Sensory/Perceptual disturbances:   WNL  Orientation: oriented to person, place, time/date, and situation  Attention: Good  Concentration: Good  Memory: WNL  Fund of knowledge:  Good  Insight:   Good  Judgment:  Good  Impulse Control: Good   Risk Assessment: Danger to Self:  No Self-injurious Behavior: No Danger to Others: No Duty to Warn:no Physical Aggression / Violence:No  Access to Firearms a concern: No  Gang Involvement:No   Subjective:   [name] participated from home, via video and consented to treatment. Therapist participated from home office. We met online due to COVID pandemic. Dayanira reviewed the events of the past week. Merla noted a recent change in her medication, for sleep, and found it to be effective. She noted an increase in her atomoxetine to 60 mg. She noted more motivation to engage in tasks. Haja noted worry about her father's cognitive functioning but noted this being not unexpected at 61 years old. We discussed possibly contacting her father's provided to provide feedback regarding concerns. She noted. She noted eating healthfully and noted feeling good with this plan, overall. She is taking her medication consistently and without concerns. Therapist encouraged mindfulness of mood and needs, continued efforts towards self-care, and proactive symptom management. Therapist praised Shawntrice and provided supportive therapy. A follow-up was scheduled for continued treatment.   Interventions:  CBT  Diagnosis:  Major depressive disorder with single episode, in partial remission (HCC)  Attention deficit hyperactivity disorder (ADHD), unspecified ADHD type   Treatment Plan:  Client Abilities/Strengths Harlie is forthcoming and motivated for change.   Support System: Family and friends.   Client Treatment Preferences Outpatient therapy.   Client Statement of Needs Tawnya would like to "why I let people push my buttons", "how to deal with people better", being more patient, how to communicate better, setting boundaries for self and others, manage symptoms, processing past events, increasing confidence, defining healthy relationship, process relationship with father, improve self-care (healthful eating, exercise).  Treatment Level Weekly  Symptoms  GAD: Feeling nervious, difficulty managing worry, worrying about about different things, trouble relaxing, restlessness, irritability, feeling afraid something awful might happen.  (Status: maintained) Depression: Loss of interest, feeling down, fluctuating sleep, poor appetite, overeating, weight-gain, feeling bad about self, difficulty concentrating.           (Status: maintained)  Goals:   Adelade experiences symptoms of depression and anxiety.    Target Date: 08/11/23 Frequency: Weekly  Progress: 0 Modality: individual    Therapist will provide referrals for additional resources as appropriate.  Therapist will provide psycho-education regarding Ambyr's diagnosis and corresponding treatment approaches and interventions. Licensed Clinical Social Worker, [place], LCSW will support the patient's ability to achieve the goals identified. will employ CBT, BA, Problem-solving, Solution Focused, Mindfulness,  coping skills, & other evidenced-based practices will be used to promote progress towards healthy functioning to help manage decrease symptoms associated with her diagnosis.   Reduce overall level, frequency, and intensity of  the feelings of depression, anxiety and panic evidenced  by decreased overall symptoms from 6 to 7 days/week to 0 to 1 days/week per client report for at least 3 consecutive months. Verbally express understanding of the relationship between feelings of depression, anxiety and their impact on thinking patterns and behaviors. Verbalize an understanding of the role that distorted thinking plays in creating fears, excessive worry, and ruminations.    [name] participated in the creation of the treatment plan)  Delight Ovens, LCSW

## 2023-04-21 ENCOUNTER — Other Ambulatory Visit: Payer: Self-pay | Admitting: Nurse Practitioner

## 2023-04-21 DIAGNOSIS — E7800 Pure hypercholesterolemia, unspecified: Secondary | ICD-10-CM

## 2023-04-30 ENCOUNTER — Ambulatory Visit (INDEPENDENT_AMBULATORY_CARE_PROVIDER_SITE_OTHER): Payer: Medicare Other | Admitting: Psychology

## 2023-04-30 DIAGNOSIS — F909 Attention-deficit hyperactivity disorder, unspecified type: Secondary | ICD-10-CM

## 2023-04-30 DIAGNOSIS — F324 Major depressive disorder, single episode, in partial remission: Secondary | ICD-10-CM | POA: Diagnosis not present

## 2023-04-30 NOTE — Progress Notes (Signed)
[facility] Counselor/Therapist Progress Note  Patient ID: DANNICA BODIN, MRN: [id number]   Date: 04/30/23  Time Spent: 11:05 am -  11:25 am :  20 Minutes  Treatment Type: Individual Therapy.  Reported Symptoms: depression and anxiety.   Mental Status Exam: Appearance:  Well Groomed     Behavior: Appropriate  Motor: Normal  Speech/Language:  Clear and Coherent  Affect: Congruent  Mood: normal  Thought process: normal  Thought content:   WNL  Sensory/Perceptual disturbances:   WNL  Orientation: oriented to person, place, time/date, and situation  Attention: Good  Concentration: Good  Memory: WNL  Fund of knowledge:  Good  Insight:   Good  Judgment:  Good  Impulse Control: Good   Risk Assessment: Danger to Self:  No Self-injurious Behavior: No Danger to Others: No Duty to Warn:no Physical Aggression / Violence:No  Access to Firearms a concern: No  Gang Involvement:No   Subjective:   [name] participated from home, via video and consented to treatment and is aware of the limitations of. Therapist participated from home office. Nitya reviewed the events of the past week. Shon noted keeping busy around the home and noted being in a good mood, overall. She noted meeting with per psychiatric provider and denied any medication changes. She continues to experience good sleep but noted some difficulty falling asleep but noted this because of staying up to watch TV or YouTube. We worked on identifying possible boundaries for bed time at 10:30 without stimulation. Psycho-education regarding ADHD and poor sleep were provided during the session. She noted commitment towards this goal. She noted socializing more consistently and feeling well, overall. She noted staying busy but noted a lack of physical exercise but noted some swimming.  Therapist encouraged Zaryia to work on identifying types of exercise that she might enjoy and to work towards engagement to further  identify ways to be more consistent.  Therapist encouraged Annitta to continue her swimming as well.  Donnamaria expressed her verbal commitment to work towards her goals.  Therapist praised Yosselyn for this during the session.  We scheduled a follow-up for continued treatment.  The second contact office for earlier appointment should the need arise.   Interventions: CBT & BA.   Diagnosis:  Major depressive disorder with single episode, in partial remission (HCC)  Attention deficit hyperactivity disorder (ADHD), unspecified ADHD type   Treatment Plan:  Client Abilities/Strengths Nuzhat is forthcoming and motivated for change.   Support System: Family and friends.   Client Treatment Preferences Outpatient therapy.   Client Statement of Needs Wanetta would like to "why I let people push my buttons", "how to deal with people better", being more patient, how to communicate better, setting boundaries for self and others, manage symptoms, processing past events, increasing confidence, defining healthy relationship, process relationship with father, improve self-care (healthful eating, exercise).  Treatment Level Weekly  Symptoms  GAD: Feeling nervious, difficulty managing worry, worrying about about different things, trouble relaxing, restlessness, irritability, feeling afraid something awful might happen.  (Status: maintained) Depression: Loss of interest, feeling down, fluctuating sleep, poor appetite, overeating, weight-gain, feeling bad about self, difficulty concentrating.           (Status: maintained)  Goals:   Tisha experiences symptoms of depression and anxiety.    Target Date: 08/11/23 Frequency: Weekly  Progress: 0 Modality: individual    Therapist will provide referrals for additional resources as appropriate.  Therapist will provide psycho-education regarding Davelyn's diagnosis and corresponding treatment approaches  and interventions. Licensed Clinical Social Worker, [place], LCSW  will support the patient's ability to achieve the goals identified. will employ CBT, BA, Problem-solving, Solution Focused, Mindfulness,  coping skills, & other evidenced-based practices will be used to promote progress towards healthy functioning to help manage decrease symptoms associated with her diagnosis.   Reduce overall level, frequency, and intensity of the feelings of depression, anxiety and panic evidenced by decreased overall symptoms from 6 to 7 days/week to 0 to 1 days/week per client report for at least 3 consecutive months. Verbally express understanding of the relationship between feelings of depression, anxiety and their impact on thinking patterns and behaviors. Verbalize an understanding of the role that distorted thinking plays in creating fears, excessive worry, and ruminations.    [name] participated in the creation of the treatment plan)  Delight Ovens, LCSW

## 2023-06-28 ENCOUNTER — Ambulatory Visit (INDEPENDENT_AMBULATORY_CARE_PROVIDER_SITE_OTHER): Payer: Medicare Other | Admitting: Psychology

## 2023-06-28 DIAGNOSIS — F324 Major depressive disorder, single episode, in partial remission: Secondary | ICD-10-CM

## 2023-06-28 DIAGNOSIS — F909 Attention-deficit hyperactivity disorder, unspecified type: Secondary | ICD-10-CM

## 2023-06-28 NOTE — Progress Notes (Signed)
[facility] Counselor/Therapist Progress Note  Patient ID: TORREN DURNAN, MRN: [id number]   Date: 06/28/23  Time Spent: 1:35 pm -  1:54 pm : 19 Minutes  Treatment Type: Individual Therapy.  Reported Symptoms: depression and anxiety.   Mental Status Exam: Appearance:  Well Groomed     Behavior: Appropriate  Motor: Normal  Speech/Language:  Clear and Coherent  Affect: Congruent  Mood: normal  Thought process: normal  Thought content:   WNL  Sensory/Perceptual disturbances:   WNL  Orientation: oriented to person, place, time/date, and situation  Attention: Good  Concentration: Good  Memory: WNL  Fund of knowledge:  Good  Insight:   Good  Judgment:  Good  Impulse Control: Good   Risk Assessment: Danger to Self:  No Self-injurious Behavior: No Danger to Others: No Duty to Warn:no Physical Aggression / Violence:No  Access to Firearms a concern: No  Gang Involvement:No   Subjective:   Virginia [name] participated from home, via video and consented to treatment and is aware of the limitations of. Therapist participated from office. Virginia [name] reviewed the events of the past week. Virginia [name] noted the events of the past month including the birth of her niece's son after numerous complications. She noted providing support by caring for her family's pets as they traverse this. She noted worry about her father's health and age and noted working on getting him to better care for himself. She denied any changes in her medication. She noted setting boundaries regarding medica consumption around bedtime and noted discontinuing media consumption past 11 pm. She continues to swim regularly when visiting family but does not have access to a pool. She does have silver sneakers and noted having a [employer] nearby. She noted working on spending time with friends and noted enjoying that. She noted not being on a "diet". She noted engaging in enjoyable activities and is [place] to visit the lake. She  denied any anxiety or depression since the last visit. She attributed her improvement to the medication and being consistent behaviorally. We scheduled a check-in appointment for later this year. Virginia [name] is welcome and aware that she can schedule a sooner appointment if needed. Therapist praised Virginia [name] for her effort in maintaining her mood. We will consider termination of treatment, at the follow-up, if Virginia [name] is continuing to do well. Therapist provided supportive therapy.   Interventions: CBT & BA.   Diagnosis:  Major depressive disorder with single episode, in partial remission (HCC)  Attention deficit hyperactivity disorder (ADHD), unspecified ADHD type   Treatment Plan:  Client Abilities/Strengths Virginia [name] is forthcoming and motivated for change.   Support System: Family and friends.   Client Treatment Preferences Outpatient therapy.   Client Statement of Needs Virginia [name] would like to "why I let people push my buttons", "how to deal with people better", being more patient, how to communicate better, setting boundaries for self and others, manage symptoms, processing past events, increasing confidence, defining healthy relationship, process relationship with father, improve self-care (healthful eating, exercise).  Treatment Level Weekly  Symptoms  GAD: Feeling nervious, difficulty managing worry, worrying about about different things, trouble relaxing, restlessness, irritability, feeling afraid something awful might happen.  (Status: maintained) Depression: Loss of interest, feeling down, fluctuating sleep, poor appetite, overeating, weight-gain, feeling bad about self, difficulty concentrating.           (Status: maintained)  Goals:   Virginia [name] experiences symptoms of depression and anxiety.    Target Date: 08/11/23 Frequency: Weekly  Progress: 0 Modality: individual  Therapist will provide referrals for additional resources as appropriate.  Therapist will provide psycho-education  regarding Virginia [name] diagnosis and corresponding treatment approaches and interventions. Licensed Clinical Social Worker, [place], LCSW will support the patient's ability to achieve the goals identified. will employ CBT, BA, Problem-solving, Solution Focused, Mindfulness,  coping skills, & other evidenced-based practices will be used to promote progress towards healthy functioning to help manage decrease symptoms associated with her diagnosis.   Reduce overall level, frequency, and intensity of the feelings of depression, anxiety and panic evidenced by decreased overall symptoms from 6 to 7 days/week to 0 to 1 days/week per client report for at least 3 consecutive months. Verbally express understanding of the relationship between feelings of depression, anxiety and their impact on thinking patterns and behaviors. Verbalize an understanding of the role that distorted thinking plays in creating fears, excessive worry, and ruminations.    Virginia [name] participated in the creation of the treatment plan)  Delight Ovens, LCSW

## 2023-07-01 ENCOUNTER — Ambulatory Visit (HOSPITAL_COMMUNITY): Payer: Medicare Other

## 2023-07-08 ENCOUNTER — Ambulatory Visit (HOSPITAL_COMMUNITY)
Admission: RE | Admit: 2023-07-08 | Discharge: 2023-07-08 | Disposition: A | Discharge status: Home / Self Care | Payer: Medicare Other | Source: Ambulatory Visit | Attending: Acute Care | Admitting: Acute Care

## 2023-07-08 DIAGNOSIS — Z87891 Personal history of nicotine dependence: Secondary | ICD-10-CM | POA: Diagnosis not present

## 2023-07-08 DIAGNOSIS — R918 Other nonspecific abnormal finding of lung field: Secondary | ICD-10-CM | POA: Insufficient documentation

## 2023-07-14 ENCOUNTER — Ambulatory Visit: Payer: Medicare Other

## 2023-07-14 ENCOUNTER — Ambulatory Visit: Payer: Medicare Other | Admitting: Internal Medicine

## 2023-07-16 ENCOUNTER — Other Ambulatory Visit: Payer: Self-pay | Admitting: Acute Care

## 2023-07-16 DIAGNOSIS — Z87891 Personal history of nicotine dependence: Secondary | ICD-10-CM

## 2023-07-16 DIAGNOSIS — Z122 Encounter for screening for malignant neoplasm of respiratory organs: Secondary | ICD-10-CM

## 2023-07-19 ENCOUNTER — Other Ambulatory Visit: Payer: Self-pay | Admitting: Nurse Practitioner

## 2023-07-19 DIAGNOSIS — E7800 Pure hypercholesterolemia, unspecified: Secondary | ICD-10-CM

## 2023-07-26 ENCOUNTER — Other Ambulatory Visit: Payer: Self-pay | Admitting: Medical Genetics

## 2023-07-26 DIAGNOSIS — Z006 Encounter for examination for normal comparison and control in clinical research program: Secondary | ICD-10-CM

## 2023-08-16 DIAGNOSIS — H31003 Unspecified chorioretinal scars, bilateral: Secondary | ICD-10-CM | POA: Diagnosis not present

## 2023-08-16 DIAGNOSIS — H52203 Unspecified astigmatism, bilateral: Secondary | ICD-10-CM | POA: Diagnosis not present

## 2023-08-17 DIAGNOSIS — D225 Melanocytic nevi of trunk: Secondary | ICD-10-CM | POA: Diagnosis not present

## 2023-08-17 DIAGNOSIS — Z1283 Encounter for screening for malignant neoplasm of skin: Secondary | ICD-10-CM | POA: Diagnosis not present

## 2023-09-16 ENCOUNTER — Telehealth: Payer: Medicare Other | Admitting: Emergency Medicine

## 2023-09-16 DIAGNOSIS — J329 Chronic sinusitis, unspecified: Secondary | ICD-10-CM | POA: Diagnosis not present

## 2023-09-16 DIAGNOSIS — H9209 Otalgia, unspecified ear: Secondary | ICD-10-CM

## 2023-09-16 MED ORDER — AZITHROMYCIN 250 MG PO TABS
ORAL_TABLET | ORAL | 0 refills | Status: DC
Start: 2023-09-16 — End: 2024-07-24

## 2023-09-16 NOTE — Progress Notes (Signed)
E-Visit for Sinus Problems  We are sorry that you are not feeling well.  Here is how we plan to help!  Based on what you have shared with me it looks like you have sinusitis that is building back into your ears and causing ear pain.  Sinusitis is inflammation and infection in the sinus cavities of the head.  Based on your presentation I believe you most likely have Acute Bacterial Sinusitis.  This is an infection caused by bacteria and is treated with antibiotics. I have prescribed Azithromycin. You may use an oral decongestant such as Mucinex D or if you have glaucoma or high blood pressure use plain Mucinex. Saline nasal spray help and can safely be used as often as needed for congestion.  If you develop worsening sinus pain, fever or notice severe headache and vision changes, or if symptoms are not better after completion of antibiotic, please schedule an appointment with a health care provider.    Sinus infections are not as easily transmitted as other respiratory infection, however we still recommend that you avoid close contact with loved ones, especially the very young and elderly.  Remember to wash your hands thoroughly throughout the day as this is the number one way to prevent the spread of infection!  Home Care: Only take medications as instructed by your medical team. Complete the entire course of an antibiotic. Do not take these medications with alcohol. A steam or ultrasonic humidifier can help congestion.  You can place a towel over your head and breathe in the steam from hot water coming from a faucet. Avoid close contacts especially the very young and the elderly. Cover your mouth when you cough or sneeze. Always remember to wash your hands.  Get Help Right Away If: You develop worsening fever or sinus pain. You develop a severe head ache or visual changes. Your symptoms persist after you have completed your treatment plan.  Make sure you Understand these instructions. Will  watch your condition. Will get help right away if you are not doing well or get worse.  Thank you for choosing an e-visit.  Your e-visit answers were reviewed by a board certified advanced clinical practitioner to complete your personal care plan. Depending upon the condition, your plan could have included both over the counter or prescription medications.  Please review your pharmacy choice. Make sure the pharmacy is open so you can pick up prescription now. If there is a problem, you may contact your provider through [employer] and have the prescription routed to another pharmacy.  Your safety is important to [place]. If you have drug allergies check your prescription carefully.   For the next 24 hours you can use MyChart to ask questions about today's visit, request a non-urgent call back, or ask for a work or school excuse. You will get an email in the next two days asking about your experience. I hope that your e-visit has been valuable and will speed your recovery.  Approximately 5 minutes was used in reviewing the patient's chart, questionnaire, prescribing medications, and documentation.

## 2023-10-11 ENCOUNTER — Other Ambulatory Visit (HOSPITAL_COMMUNITY): Payer: Medicare Other

## 2023-10-12 ENCOUNTER — Other Ambulatory Visit: Payer: Self-pay | Admitting: Nurse Practitioner

## 2023-10-12 DIAGNOSIS — E7800 Pure hypercholesterolemia, unspecified: Secondary | ICD-10-CM

## 2023-10-13 MED ORDER — ATORVASTATIN CALCIUM 40 MG PO TABS
ORAL_TABLET | ORAL | 0 refills | Status: DC
Start: 2023-10-13 — End: 2023-10-19

## 2023-10-19 ENCOUNTER — Other Ambulatory Visit: Payer: Self-pay | Admitting: Nurse Practitioner

## 2023-10-19 DIAGNOSIS — E7800 Pure hypercholesterolemia, unspecified: Secondary | ICD-10-CM

## 2023-10-19 MED ORDER — ATORVASTATIN CALCIUM 40 MG PO TABS: ORAL_TABLET | ORAL | 0 refills | Status: DC

## 2023-10-20 ENCOUNTER — Ambulatory Visit: Payer: Medicare Other

## 2023-10-20 DIAGNOSIS — Z0000 Encounter for general adult medical examination without abnormal findings: Secondary | ICD-10-CM | POA: Diagnosis not present

## 2023-10-20 NOTE — Patient Instructions (Signed)
Virginia [name] , Thank you for taking time to come for your Medicare Wellness Visit. I appreciate your ongoing commitment to your health goals. Please review the following plan we discussed and let me know if I can assist you in the future.   Referrals/Orders/Follow-Ups/Clinician Recommendations: none  This is a list of the screening recommended for you and due dates:  Health Maintenance  Topic Date Due   Pap with HPV screening  07/23/2014   COVID-19 Vaccine (7 - 2023-24 season) 09/28/2023   Colon Cancer Screening  12/27/2023   Mammogram  02/26/2024   Screening for Lung Cancer  07/07/2024   Medicare Annual Wellness Visit  10/19/2024   DTaP/Tdap/Td vaccine (3 - Td or Tdap) 01/30/2032   Flu Shot  Completed   Hepatitis C Screening  Completed   HIV Screening  Completed   Zoster (Shingles) Vaccine  Completed   HPV Vaccine  Aged Out    Advanced directives: (Copy Requested) Please bring a copy of your health care power of attorney and living will to the office to be added to your chart at your convenience.  Next Medicare Annual Wellness Visit scheduled for next year: Yes  insert Preventive Care Attachment Reference

## 2023-10-20 NOTE — Progress Notes (Signed)
Subjective:   Virginia [name] is a 62 y.o. female who presents for Medicare Annual (Subsequent) preventive examination.  Visit Complete: Virtual I connected with  Virginia [name] on 10/20/23 by a audio enabled telemedicine application and verified that I am speaking with the correct person using two identifiers.  Patient Location: Home  Provider Location: Office/Clinic  I discussed the limitations of evaluation and management by telemedicine. The patient expressed understanding and agreed to proceed.  Vital Signs: Because this visit was a virtual/telehealth visit, some criteria may be missing or patient reported. Any vitals not documented were not able to be obtained and vitals that have been documented are patient reported.  Patient Medicare AWV questionnaire was completed by the patient on 10/20/2023; I have confirmed that all information answered by patient is correct and no changes since this date.  Cardiac Risk Factors include: none     Objective:    Today's Vitals   10/20/23 1434  PainSc: 5    There is no height or weight on file to calculate BMI.     10/20/2023    2:39 PM 07/08/2022   12:00 PM 05/01/2021    2:06 PM 04/24/2020    2:06 PM 04/12/2019    3:39 PM 07/14/2012    4:30 PM  Advanced Directives  Does Patient Have a Medical Advance Directive? Yes Yes Yes Yes Yes Patient does not have advance directive;Patient would not like information  Type of Public librarian Power of [place];Living will Healthcare Power of [place];Living will Healthcare Power of [place];Living will Healthcare Power of [place];Living will Healthcare Power of [place];Living will   Copy of Healthcare Power of Attorney in Chart? No - copy requested No - copy requested No - copy requested No - copy requested No - copy requested     Current Medications (verified) Outpatient Encounter Medications as of 10/20/2023  Medication Sig   albuterol (VENTOLIN HFA) 108 (90 Base) MCG/ACT inhaler  INHALE 2 PUFFS INTO THE LUNGS EVERY 6 HOURS AS NEEDED FOR WHEEZING OR SHORTNESS OF BREATH   atomoxetine (STRATTERA) 25 MG capsule Take 50 mg by mouth daily.   atorvastatin (LIPITOR) 40 MG tablet TAKE 1 TABLET BY MOUTH ONCE DAILY ON  MONDAYS  THROUGH  FRIDAYS   b complex vitamins capsule Take 1 capsule by mouth daily.   Clindamycin Phosphate foam Apply 1 application. topically 2 (two) times daily.   desvenlafaxine (PRISTIQ) 100 MG 24 hr tablet Take 1 tablet by mouth daily.   Vitamin D, Ergocalciferol, (DRISDOL) 1.25 MG (50000 UNIT) CAPS capsule TAKE ONE CAPSULE BY MOUTH EVERY 7 DAYS   azithromycin (ZITHROMAX) 250 MG tablet Take 2 tabs today, then take 1 tab daily until gone.   zolpidem (AMBIEN) 10 MG tablet Take by mouth.   No facility-administered encounter medications on file as of 10/20/2023.    Allergies (verified) Doxycycline, Penicillins, and Rosuvastatin   History: Past Medical History:  Diagnosis Date   ADHD    Allergy N/a   Anxiety    Arthritis    Back pain    Chest pain, unspecified    COPD (chronic obstructive pulmonary disease) (HCC) N/a   Depression    Dermatitis    Emphysema of lung (HCC) N/a   Fatty liver    GERD (gastroesophageal reflux disease)    HLD (hyperlipidemia)    no current med.   Insomnia    Joint pain    Loss of hair    Low back pain    Major depressive  disorder, single episode, mild (HCC)    Personal history of colonic polyps    Shoulder impingement 06/2012   left   Vitamin D deficiency    Wears dentures    upper denture   Wears partial dentures    lower partial   Past Surgical History:  Procedure Laterality Date   ABDOMINAL HYSTERECTOMY  1990s   partial   APPENDECTOMY  06/08/2006   CARPAL TUNNEL RELEASE  01/14/2005   left; with left cubital tunnel release   EXPLORATORY LAPAROTOMY WITH ABDOMINAL MASS EXCISION  06/08/2006   resection right ovarian mass, partial omentectomy   FULKERSON SLIDE  10/19/2007   right knee   JOINT REPLACEMENT   04/30/2013   KNEE ARTHROSCOPY  05/28/2010   left   KNEE ARTHROSCOPY     right   SALPINGOOPHORECTOMY  06/08/2006   bilat.   TOTAL KNEE ARTHROPLASTY  05/04/2011   left   Family History  Problem Relation Age of Onset   Hyperlipidemia Mother    COPD Mother    Heart disease Mother    Diabetes Mother    Depression Mother    Heart Problems Mother    Anxiety disorder Mother    Hypertension Father    Hypothyroidism Father    ADD / ADHD Father    Arthritis Father    Varicose Veins Father    Heart attack Other    Hypertension Other    Diabetes Other    COPD Other    Social History   Socioeconomic History   Marital status: Single    Spouse name: Not on file   Number of children: Not on file   Years of education: Not on file   Highest education level: Not on file  Occupational History   Occupation: disability  Tobacco Use   Smoking status: Former    Current packs/day: 0.00    Average packs/day: 2.8 packs/day for 43.3 years (120.0 ttl pk-yrs)    Types: Cigarettes, E-cigarettes    Start date: 41    Quit date: 2018    Years since quitting: 6.9   Smokeless tobacco: Never   Tobacco comments:    quit smoking 02/2017  Vaping Use   Vaping status: Never Used  Substance and Sexual Activity   Alcohol use: Yes    Alcohol/week: 1.0 standard drink of alcohol    Types: 1 Cans of beer per week    Comment: moderate, socially   Drug use: No   Sexual activity: Not Currently    Birth control/protection: None  Other Topics Concern   Not on file  Social History Narrative   Not on file   Social Determinants of Health   Financial Resource Strain: Low Risk  (10/20/2023)   Overall Financial Resource Strain (CARDIA)    Difficulty of Paying Living Expenses: Not very hard  Food Insecurity: No Food Insecurity (10/20/2023)   Hunger Vital Sign    Worried About Running Out of Food in the Last Year: Never true    Ran Out of Food in the Last Year: Never true  Transportation Needs: No  Transportation Needs (10/20/2023)   PRAPARE - Administrator, Civil Service (Medical): No    Lack of Transportation (Non-Medical): No  Physical Activity: Patient Declined (10/20/2023)   Exercise Vital Sign    Days of Exercise per Week: Patient declined    Minutes of Exercise per Session: Patient declined  Stress: No Stress Concern Present (10/20/2023)   [employer] of Occupational Health - Occupational  Stress Questionnaire    Feeling of Stress : Only a little  Social Connections: Unknown (10/20/2023)   Social Connection and Isolation Panel [NHANES]    Frequency of Communication with Friends and Family: Twice a week    Frequency of Social Gatherings with Friends and Family: Twice a week    Attends Religious Services: Not on Marketing executive or Organizations: No    Attends Banker Meetings: Never    Marital Status: Never married    Tobacco Counseling Counseling given: Not Answered Tobacco comments: quit smoking 02/2017   Clinical Intake:  Pre-visit preparation completed: Yes  Pain : 0-10 Pain Score: 5  Pain Type: Chronic pain Pain Location: Back Pain Orientation: Lower Pain Descriptors / Indicators: Aching Pain Onset: More than a month ago Pain Frequency: Constant     Nutritional Risks: None Diabetes: No  How often do you need to have someone help you when you read instructions, pamphlets, or other written materials from your doctor or pharmacy?: 1 - Never  Interpreter Needed?: No  Information entered by :: NAllen LPN   Activities of Daily Living    10/20/2023    1:35 PM  In your present state of health, do you have any difficulty performing the following activities:  Hearing? 0  Vision? 0  Difficulty concentrating or making decisions? 0  Walking or climbing stairs? 0  Dressing or bathing? 0  Doing errands, shopping? 0  Preparing Food and eating ? N  Using the Toilet? N  In the past six months, have you accidently  leaked urine? Y  Do you have problems with loss of bowel control? N  Managing your Medications? N  Managing your Finances? N  Housekeeping or managing your Housekeeping? N    Patient Care Team: Dorothyann Peng, MD as PCP - General (Internal Medicine) [facility], [facility]  Indicate any recent Medical Services you may have received from other than [facility] providers in the past year (date may be approximate).     Assessment:   This is a routine wellness examination for [place].  Hearing/Vision screen Hearing Screening - Comments:: Denies hearing issues Vision Screening - Comments:: Regular eye exams, [facility]   Goals Addressed             This Visit's Progress    Patient Stated       10/20/2023, wants to lose weight       Depression Screen    10/20/2023    2:40 PM 01/19/2023    3:35 PM 09/16/2022    7:17 AM 08/25/2022    4:12 PM 07/08/2022   12:01 PM 05/01/2021    2:07 PM 11/06/2020    2:04 PM  PHQ 2/9 Scores  PHQ - 2 Score 0 6 5 3 1 4  0  PHQ- 9 Score 0 17 11 12  6  0    Fall Risk    10/20/2023    1:35 PM 01/19/2023    3:34 PM 10/26/2022    3:52 PM 08/25/2022    4:12 PM 07/08/2022   12:01 PM  Fall Risk   Falls in the past year? 0 0 0 0 0  Number falls in past yr: 0  0 0 0  Injury with Fall? 0  0 0 0  Risk for fall due to : Medication side effect  No Fall Risks No Fall Risks Medication side effect  Follow up Falls prevention discussed;Falls evaluation completed  Falls evaluation completed Falls evaluation  completed Falls evaluation completed;Education provided;Falls prevention discussed    MEDICARE RISK AT HOME: Medicare Risk at Home Any stairs in or around the home?: Yes If so, are there any without handrails?: No Home free of loose throw rugs in walkways, pet beds, electrical cords, etc?: Yes Adequate lighting in your home to reduce risk of falls?: Yes Life alert?: No Use of a cane, walker or w/c?: No Grab bars in the bathroom?: No Shower chair  or bench in shower?: No Elevated toilet seat or a handicapped toilet?: No  TIMED UP AND GO:  Was the test performed?  No    Cognitive Function:        10/20/2023    2:40 PM 07/08/2022   12:03 PM 05/01/2021    2:10 PM 04/24/2020    2:09 PM 04/12/2019    3:42 PM  6CIT Screen  What Year? 0 points 0 points 0 points 0 points 0 points  What month? 0 points 0 points 0 points 0 points 0 points  What time? 0 points 0 points 0 points 0 points 0 points  Count back from 20 0 points 0 points 0 points 0 points 0 points  Months in reverse 0 points 0 points 0 points 0 points 0 points  Repeat phrase 0 points 4 points 2 points 4 points 0 points  Total Score 0 points 4 points 2 points 4 points 0 points    Immunizations Immunization History  Administered Date(s) Administered   DTaP 08/12/2010   Influenza Inj Mdck Quad With Preservative 10/25/2017   Influenza,inj,Quad PF,6+ Mos 11/06/2019, 11/06/2020   Influenza-Unspecified 11/24/2021, 10/27/2022, 08/03/2023   PFIZER Comirnaty(Gray Top)Covid-19 Tri-Sucrose Vaccine 08/07/2022   PFIZER(Purple Top)SARS-COV-2 Vaccination 02/06/2020, 02/27/2020, 09/15/2020, 11/24/2021   Pfizer(Comirnaty)Fall Seasonal Vaccine 12 years and older 08/03/2023   Respiratory Syncytial Virus Vaccine,Recomb Aduvanted(Arexvy) 10/27/2022   Tdap 01/29/2022   Zoster Recombinant(Shingrix) 02/12/2022, 08/07/2022    TDAP status: Up to date  Flu Vaccine status: Up to date  Pneumococcal vaccine status: Up to date  Covid-19 vaccine status: Completed vaccines  Qualifies for Shingles Vaccine? Yes   Zostavax completed Yes   Shingrix Completed?: Yes  Screening Tests Health Maintenance  Topic Date Due   Cervical Cancer Screening (HPV/Pap Cotest)  07/23/2014   COVID-19 Vaccine (7 - 2023-24 season) 09/28/2023   Colonoscopy  12/27/2023   MAMMOGRAM  02/26/2024   Lung Cancer Screening  07/07/2024   Medicare Annual Wellness (AWV)  10/19/2024   DTaP/Tdap/Td (3 - Td or Tdap)  01/30/2032   INFLUENZA VACCINE  Completed   Hepatitis C Screening  Completed   HIV Screening  Completed   Zoster Vaccines- Shingrix  Completed   HPV VACCINES  Aged Out    Health Maintenance  Health Maintenance Due  Topic Date Due   Cervical Cancer Screening (HPV/Pap Cotest)  07/23/2014   COVID-19 Vaccine (7 - 2023-24 season) 09/28/2023   Colonoscopy  12/27/2023    Colorectal cancer screening: Type of screening: Colonoscopy. Completed 12/26/2020. Repeat every 3 years  Mammogram status: Completed 02/25/2022. Repeat every year  Bone Density status: n/a  Lung Cancer Screening: (Low Dose CT Chest recommended if Age 79-80 years, 20 pack-year currently smoking OR have quit w/in 15years.) does qualify.   Lung Cancer Screening Referral: CT scan 07/08/2023  Additional Screening:  Hepatitis C Screening: does qualify; Completed 12/05/2018  Vision Screening: Recommended annual ophthalmology exams for early detection of glaucoma and other disorders of the eye. Is the patient up to date with their annual eye exam?  Yes  Who is the provider or what is the name of the office in which the patient attends annual eye exams? [facility] If pt is not established with a provider, would they like to be referred to a provider to establish care? No .   Dental Screening: Recommended annual dental exams for proper oral hygiene  Diabetic Foot Exam: n/a  Community Resource Referral / Chronic Care Management: CRR required this visit?  No   CCM required this visit?  No     Plan:     I have personally reviewed and noted the following in the patient's chart:   Medical and social history Use of alcohol, tobacco or illicit drugs  Current medications and supplements including opioid prescriptions. Patient is not currently taking opioid prescriptions. Functional ability and status Nutritional status Physical activity Advanced directives List of other physicians Hospitalizations, surgeries, and  ER visits in previous 12 months Vitals Screenings to include cognitive, depression, and falls Referrals and appointments  In addition, I have reviewed and discussed with patient certain preventive protocols, quality metrics, and best practice recommendations. A written personalized care plan for preventive services as well as general preventive health recommendations were provided to patient.     Barb Merino, LPN   09/24/1477   After Visit Summary: (MyChart) Due to this being a telephonic visit, the after visit summary with patients personalized plan was offered to patient via MyChart   Nurse Notes: none

## 2023-10-21 ENCOUNTER — Other Ambulatory Visit: Payer: Self-pay

## 2023-10-21 DIAGNOSIS — E7800 Pure hypercholesterolemia, unspecified: Secondary | ICD-10-CM

## 2023-10-21 MED ORDER — ATORVASTATIN CALCIUM 40 MG PO TABS: ORAL_TABLET | ORAL | 2 refills | Status: DC

## 2023-11-01 ENCOUNTER — Ambulatory Visit: Payer: Medicare Other | Admitting: Psychology

## 2023-11-01 DIAGNOSIS — F909 Attention-deficit hyperactivity disorder, unspecified type: Secondary | ICD-10-CM | POA: Diagnosis not present

## 2023-11-01 DIAGNOSIS — F324 Major depressive disorder, single episode, in partial remission: Secondary | ICD-10-CM

## 2023-11-01 NOTE — Progress Notes (Addendum)
[facility] Counselor/Therapist Progress Note  Patient ID: Virginia [name], MRN: [id number]   Date: 11/01/23  Time Spent: 1:34 pm -  1:52 pm : 18  Minutes  Treatment Type: Individual Therapy.  Reported Symptoms: depression and anxiety.   Mental Status Exam: Appearance:  Well Groomed     Behavior: Appropriate  Motor: Normal  Speech/Language:  Clear and Coherent  Affect: Congruent  Mood: normal  Thought process: normal  Thought content:   WNL  Sensory/Perceptual disturbances:   WNL  Orientation: oriented to person, place, time/date, and situation  Attention: Good  Concentration: Good  Memory: WNL  Fund of knowledge:  Good  Insight:   Good  Judgment:  Good  Impulse Control: Good   Risk Assessment: Danger to Self:  No Self-injurious Behavior: No Danger to Others: No Duty to Warn:no Physical Aggression / Violence:No  Access to Firearms a concern: No  Gang Involvement:No   Subjective:   Virginia [name] participated from home, via video and consented to treatment and is aware of the limitations of. Therapist participated from office. Virginia [name] reviewed the events of the past week. Virginia [name] noted meeting  with prescriber, around 2 weeks ago, but denied any changes to her meds. She noted her father doing well, overall. She noted that is adjusting well to having hearing aids despite his initial reaction to the suggestion. She continues dog sit and noted enjoying this, overall. She noted socially that things are going well. She has not began exercising and her membership has lapsed. She noted not having much motivation to go to the gym. She noted that she does "Get out and walk around a lot". She noted not sticking to her diet during the past few months. She noted having the necessary paperwork for her previous plan. She noted that she has reduced her media consumption before bed and noted this going "good". She could not identify any other goals than previously discussed. She noted  her upcoming Christmas plan to spend with family. We scheduled a booster session, next year, with Virginia [name]'s suggestion. Therapist praised Virginia [name] for her effort to manage her mood and encouraged effort towards diet and exercise. Therapist provided supportive therapy.   Interventions: CBT   Diagnosis:  Major depressive disorder with single episode, in partial remission (HCC)  Attention deficit hyperactivity disorder (ADHD), unspecified ADHD type   Treatment Plan:  Client Abilities/Strengths Virginia [name] is forthcoming and motivated for change.   Support System: Family and friends.   Client Treatment Preferences Outpatient therapy.   Client Statement of Needs Virginia [name] would like to "why I let people push my buttons", "how to deal with people better", being more patient, how to communicate better, setting boundaries for self and others, manage symptoms, processing past events, increasing confidence, defining healthy relationship, process relationship with father, improve self-care (healthful eating, exercise).  Treatment Level Weekly  Symptoms  GAD: Feeling nervious, difficulty managing worry, worrying about about different things, trouble relaxing, restlessness, irritability, feeling afraid something awful might happen.  (Status: maintained) Depression: Loss of interest, feeling down, fluctuating sleep, poor appetite, overeating, weight-gain, feeling bad about self, difficulty concentrating.           (Status: maintained)  Goals:   Virginia [name] experiences symptoms of depression and anxiety.    Target Date: 12/11/23 Frequency: Weekly  Progress: 0 Modality: individual    Therapist will provide referrals for additional resources as appropriate.  Therapist will provide psycho-education regarding Virginia [name] diagnosis and corresponding treatment approaches and interventions. Licensed Visual merchandiser,  Delight Ovens, LCSW will support the patient's ability to achieve the goals identified. will employ CBT,  BA, Problem-solving, Solution Focused, Mindfulness,  coping skills, & other evidenced-based practices will be used to promote progress towards healthy functioning to help manage decrease symptoms associated with her diagnosis.   Reduce overall level, frequency, and intensity of the feelings of depression, anxiety and panic evidenced by decreased overall symptoms from 6 to 7 days/week to 0 to 1 days/week per client report for at least 3 consecutive months. Verbally express understanding of the relationship between feelings of depression, anxiety and their impact on thinking patterns and behaviors. Verbalize an understanding of the role that distorted thinking plays in creating fears, excessive worry, and ruminations.    Virginia [name] participated in the creation of the treatment plan)  Delight Ovens, LCSW

## 2023-12-31 DIAGNOSIS — G479 Sleep disorder, unspecified: Secondary | ICD-10-CM | POA: Diagnosis not present

## 2024-01-31 ENCOUNTER — Encounter: Payer: Medicare Other | Admitting: Internal Medicine

## 2024-03-31 DIAGNOSIS — G479 Sleep disorder, unspecified: Secondary | ICD-10-CM | POA: Diagnosis not present

## 2024-05-09 DIAGNOSIS — M5136 Other intervertebral disc degeneration, lumbar region with discogenic back pain only: Secondary | ICD-10-CM | POA: Diagnosis not present

## 2024-05-09 DIAGNOSIS — M9904 Segmental and somatic dysfunction of sacral region: Secondary | ICD-10-CM | POA: Diagnosis not present

## 2024-05-09 DIAGNOSIS — M9903 Segmental and somatic dysfunction of lumbar region: Secondary | ICD-10-CM | POA: Diagnosis not present

## 2024-05-09 DIAGNOSIS — M9905 Segmental and somatic dysfunction of pelvic region: Secondary | ICD-10-CM | POA: Diagnosis not present

## 2024-05-10 ENCOUNTER — Ambulatory Visit: Admitting: Psychology

## 2024-07-10 ENCOUNTER — Ambulatory Visit (HOSPITAL_COMMUNITY)
Admission: RE | Admit: 2024-07-10 | Discharge: 2024-07-10 | Disposition: A | Discharge status: Home / Self Care | Source: Ambulatory Visit | Attending: Acute Care | Admitting: Acute Care

## 2024-07-10 DIAGNOSIS — Z122 Encounter for screening for malignant neoplasm of respiratory organs: Secondary | ICD-10-CM | POA: Diagnosis not present

## 2024-07-10 DIAGNOSIS — Z87891 Personal history of nicotine dependence: Secondary | ICD-10-CM | POA: Insufficient documentation

## 2024-07-18 ENCOUNTER — Other Ambulatory Visit: Payer: Self-pay | Admitting: Nurse Practitioner

## 2024-07-18 DIAGNOSIS — Z1231 Encounter for screening mammogram for malignant neoplasm of breast: Secondary | ICD-10-CM

## 2024-07-21 ENCOUNTER — Ambulatory Visit
Admission: RE | Admit: 2024-07-21 | Discharge: 2024-07-21 | Disposition: A | Discharge status: Home / Self Care | Source: Ambulatory Visit | Attending: Nurse Practitioner | Admitting: Nurse Practitioner

## 2024-07-21 DIAGNOSIS — Z1231 Encounter for screening mammogram for malignant neoplasm of breast: Secondary | ICD-10-CM

## 2024-07-24 ENCOUNTER — Ambulatory Visit (INDEPENDENT_AMBULATORY_CARE_PROVIDER_SITE_OTHER): Admitting: Internal Medicine

## 2024-07-24 ENCOUNTER — Encounter: Payer: Self-pay | Admitting: Internal Medicine

## 2024-07-24 VITALS — BP 110/80 | HR 76 | Temp 97.5°F | Ht 63.0 in | Wt 197.4 lb

## 2024-07-24 DIAGNOSIS — E66811 Obesity, class 1: Secondary | ICD-10-CM | POA: Insufficient documentation

## 2024-07-24 DIAGNOSIS — E559 Vitamin D deficiency, unspecified: Secondary | ICD-10-CM | POA: Diagnosis not present

## 2024-07-24 DIAGNOSIS — Z23 Encounter for immunization: Secondary | ICD-10-CM | POA: Diagnosis not present

## 2024-07-24 DIAGNOSIS — Z860100 Personal history of colon polyps, unspecified: Secondary | ICD-10-CM

## 2024-07-24 DIAGNOSIS — R7989 Other specified abnormal findings of blood chemistry: Secondary | ICD-10-CM | POA: Diagnosis not present

## 2024-07-24 DIAGNOSIS — F411 Generalized anxiety disorder: Secondary | ICD-10-CM

## 2024-07-24 DIAGNOSIS — E7849 Other hyperlipidemia: Secondary | ICD-10-CM | POA: Diagnosis not present

## 2024-07-24 DIAGNOSIS — Z1211 Encounter for screening for malignant neoplasm of colon: Secondary | ICD-10-CM | POA: Diagnosis not present

## 2024-07-24 DIAGNOSIS — F339 Major depressive disorder, recurrent, unspecified: Secondary | ICD-10-CM

## 2024-07-24 NOTE — Progress Notes (Signed)
 New Patient Office Visit     CC/Reason for Visit: Establish care, discuss chronic medical concerns Previous PCP: Catheryn Slocumb, MD Last Visit: Unknown  HPI: Virginia [name] is a 63 y.o. female who is coming in today for the above mentioned reasons. Past Medical History is significant for: ADHD, anxiety, depression followed by psychiatry, vitamin B12 and D deficiencies, hyperlipidemia and history of colon polyps.  She is now overdue for her 3-year follow-up colonoscopy, she no longer would like to follow-up with GI in [place] and is requesting a local referral.  She is a former smoker of over 40 years of 3 packs a day.  She just recently had her annual CT chest and recommended follow-up in 1 year.  Requesting flu vaccine.   Past Medical/Surgical History: Past Medical History:  Diagnosis Date   ADHD    Allergy N/a   Anxiety    Arthritis    Back pain    Chest pain, unspecified    COPD (chronic obstructive pulmonary disease) (HCC) N/a   Depression    Dermatitis    Emphysema of lung (HCC) N/a   Fatty liver    GERD (gastroesophageal reflux disease)    HLD (hyperlipidemia)    no current med.   Insomnia    Joint pain    Loss of hair    Low back pain    Major depressive disorder, single episode, mild (HCC)    Personal history of colonic polyps    Shoulder impingement 06/2012   left   Vitamin D  deficiency    Wears dentures    upper denture   Wears partial dentures    lower partial    Past Surgical History:  Procedure Laterality Date   ABDOMINAL HYSTERECTOMY  1990s   partial   APPENDECTOMY  06/08/2006   CARPAL TUNNEL RELEASE  01/14/2005   left; with left cubital tunnel release   EXPLORATORY LAPAROTOMY WITH ABDOMINAL MASS EXCISION  06/08/2006   resection right ovarian mass, partial omentectomy   FULKERSON SLIDE  10/19/2007   right knee   JOINT REPLACEMENT  04/30/2013   KNEE ARTHROSCOPY  05/28/2010   left   KNEE ARTHROSCOPY     right   SALPINGOOPHORECTOMY   06/08/2006   bilat.   TOTAL KNEE ARTHROPLASTY  05/04/2011   left    Social History:  reports that she quit smoking about 7 years ago. Her smoking use included cigarettes and e-cigarettes. She started smoking about 47 years ago. She has a 119.9 pack-year smoking history. She has never used smokeless tobacco. She reports current alcohol use of about 1.0 standard drink of alcohol per week. She reports that she does not use drugs.  Allergies: Allergies  Allergen Reactions   Doxycycline Itching   Penicillins Hives and Swelling    SWELLING OF TONGUE SWELLING OF JOINTS   Rosuvastatin Other (See Comments)    MUSCLE ACHES    Family History:  Family History  Problem Relation Age of Onset   Hyperlipidemia Mother    COPD Mother    Heart disease Mother    Diabetes Mother    Depression Mother    Heart Problems Mother    Anxiety disorder Mother    Hypertension Father    Hypothyroidism Father    ADD / ADHD Father    Arthritis Father    Varicose Veins Father    Heart attack Other    Hypertension Other    Diabetes Other    COPD Other  Breast cancer Neg Hx    BRCA 1/2 Neg Hx      Current Outpatient Medications:    albuterol  (VENTOLIN  HFA) 108 (90 Base) MCG/ACT inhaler, INHALE 2 PUFFS INTO THE LUNGS EVERY 6 HOURS AS NEEDED FOR WHEEZING OR SHORTNESS OF BREATH, Disp: 6.7 g, Rfl: 1   atomoxetine (STRATTERA) 25 MG capsule, Take 50 mg by mouth daily., Disp: , Rfl:    atorvastatin  (LIPITOR) 40 MG tablet, TAKE 1 TABLET BY MOUTH ONCE DAILY ON  MONDAYS  THROUGH  FRIDAYS, Disp: 90 tablet, Rfl: 2   b complex vitamins capsule, Take 1 capsule by mouth daily., Disp: , Rfl:    desvenlafaxine (PRISTIQ) 100 MG 24 hr tablet, Take 1 tablet by mouth daily., Disp: , Rfl:    Eszopiclone 3 MG TABS, Take 3 mg by mouth., Disp: , Rfl:    Vitamin D , Ergocalciferol , (DRISDOL ) 1.25 MG (50000 UNIT) CAPS capsule, TAKE ONE CAPSULE BY MOUTH EVERY 7 DAYS, Disp: 15 capsule, Rfl: 3  Review of Systems:  Negative  except as indicated in HPI.   Physical Exam: Vitals:   07/24/24 1050  BP: 110/80  Pulse: 76  Temp: (!) 97.5 F (36.4 C)  TempSrc: Oral  SpO2: 98%  Weight: 197 lb 6.4 oz (89.5 kg)  Height: 5' 3 (1.6 m)   Body mass index is 34.97 kg/m.  Physical Exam Vitals reviewed.  Constitutional:      Appearance: Normal appearance.  HENT:     Head: Normocephalic and atraumatic.  Eyes:     Conjunctiva/sclera: Conjunctivae normal.  Cardiovascular:     Rate and Rhythm: Normal rate and regular rhythm.  Pulmonary:     Effort: Pulmonary effort is normal.     Breath sounds: Normal breath sounds.  Skin:    General: Skin is warm and dry.  Neurological:     General: No focal deficit present.     Mental Status: She is alert and oriented to person, place, and time.  Psychiatric:        Mood and Affect: Mood normal.        Behavior: Behavior normal.        Thought Content: Thought content normal.        Judgment: Judgment normal.       Impression and Plan:  Screening for malignant neoplasm of colon -     Ambulatory referral to Gastroenterology  History of colonic polyps  Obesity (BMI 30.0-34.9)  Vitamin D  deficiency  Other hyperlipidemia  Recurrent major depressive disorder, remission status unspecified (HCC)  Low vitamin B12 level  GAD (generalized anxiety disorder)  Immunization due   - Flu and PCV 20 administered in office today. - Mental health issues continue to be followed by psychiatry. - Placed referral to local GI as she is now due for 3-year colonoscopy. - Mood is stable. -Discussed healthy lifestyle, including increased physical activity and better food choices to promote weight loss.   Time spent: 46 minutes reviewing chart, interviewing and examining patient and formulating plan of care.       Tully Theophilus Andrews, MD [facility]

## 2024-07-24 NOTE — Addendum Note (Signed)
 Addended by: KATHRYNE MILLMAN B on: 07/24/2024 11:45 AM   Modules accepted: Orders

## 2024-08-02 ENCOUNTER — Other Ambulatory Visit: Payer: Self-pay | Admitting: Acute Care

## 2024-08-02 DIAGNOSIS — Z122 Encounter for screening for malignant neoplasm of respiratory organs: Secondary | ICD-10-CM

## 2024-08-02 DIAGNOSIS — Z87891 Personal history of nicotine dependence: Secondary | ICD-10-CM

## 2024-08-08 ENCOUNTER — Encounter: Payer: Self-pay | Admitting: Internal Medicine

## 2024-08-08 ENCOUNTER — Ambulatory Visit (INDEPENDENT_AMBULATORY_CARE_PROVIDER_SITE_OTHER): Admitting: Internal Medicine

## 2024-08-08 VITALS — BP 130/80 | HR 80 | Temp 97.8°F | Ht 63.0 in | Wt 196.6 lb

## 2024-08-08 DIAGNOSIS — E559 Vitamin D deficiency, unspecified: Secondary | ICD-10-CM | POA: Diagnosis not present

## 2024-08-08 DIAGNOSIS — Z1211 Encounter for screening for malignant neoplasm of colon: Secondary | ICD-10-CM | POA: Diagnosis not present

## 2024-08-08 DIAGNOSIS — E7849 Other hyperlipidemia: Secondary | ICD-10-CM | POA: Diagnosis not present

## 2024-08-08 DIAGNOSIS — R7989 Other specified abnormal findings of blood chemistry: Secondary | ICD-10-CM | POA: Diagnosis not present

## 2024-08-08 DIAGNOSIS — Z0000 Encounter for general adult medical examination without abnormal findings: Secondary | ICD-10-CM

## 2024-08-08 DIAGNOSIS — K219 Gastro-esophageal reflux disease without esophagitis: Secondary | ICD-10-CM | POA: Diagnosis not present

## 2024-08-08 DIAGNOSIS — E66811 Obesity, class 1: Secondary | ICD-10-CM | POA: Diagnosis not present

## 2024-08-08 LAB — CBC WITH DIFFERENTIAL/PLATELET
Basophils Absolute: 0.1 K/uL (ref 0.0–0.1)
Basophils Relative: 1 % (ref 0.0–3.0)
Eosinophils Absolute: 0.1 K/uL (ref 0.0–0.7)
Eosinophils Relative: 1.4 % (ref 0.0–5.0)
HCT: 41.2 % (ref 36.0–46.0)
Hemoglobin: 13.8 g/dL (ref 12.0–15.0)
Lymphocytes Relative: 21.7 % (ref 12.0–46.0)
Lymphs Abs: 1.4 K/uL (ref 0.7–4.0)
MCHC: 33.5 g/dL (ref 30.0–36.0)
MCV: 91.2 fl (ref 78.0–100.0)
Monocytes Absolute: 0.4 K/uL (ref 0.1–1.0)
Monocytes Relative: 6.4 % (ref 3.0–12.0)
Neutro Abs: 4.6 K/uL (ref 1.4–7.7)
Neutrophils Relative %: 69.5 % (ref 43.0–77.0)
Platelets: 234 K/uL (ref 150.0–400.0)
RBC: 4.52 Mil/uL (ref 3.87–5.11)
RDW: 13.6 % (ref 11.5–15.5)
WBC: 6.7 K/uL (ref 4.0–10.5)

## 2024-08-08 LAB — LIPID PANEL
Cholesterol: 187 mg/dL (ref 0–200)
HDL: 55.3 mg/dL (ref >39.00)
LDL Cholesterol: 101 mg/dL — ABNORMAL HIGH (ref 0–99)
NonHDL: 131.88
Total CHOL/HDL Ratio: 3
Triglycerides: 155 mg/dL — ABNORMAL HIGH (ref 0.0–149.0)
VLDL: 31 mg/dL (ref 0.0–40.0)

## 2024-08-08 LAB — COMPREHENSIVE METABOLIC PANEL WITH GFR
ALT: 27 U/L (ref 0–35)
AST: 25 U/L (ref 0–37)
Albumin: 4.3 g/dL (ref 3.5–5.2)
Alkaline Phosphatase: 82 U/L (ref 39–117)
BUN: 10 mg/dL (ref 6–23)
CO2: 28 meq/L (ref 19–32)
Calcium: 9.5 mg/dL (ref 8.4–10.5)
Chloride: 103 meq/L (ref 96–112)
Creatinine, Ser: 0.94 mg/dL (ref 0.40–1.20)
GFR: 64.96 mL/min (ref >60.00)
Glucose, Bld: 107 mg/dL — ABNORMAL HIGH (ref 70–99)
Potassium: 3.9 meq/L (ref 3.5–5.1)
Sodium: 139 meq/L (ref 135–145)
Total Bilirubin: 0.5 mg/dL (ref 0.2–1.2)
Total Protein: 7 g/dL (ref 6.0–8.3)

## 2024-08-08 LAB — VITAMIN B12: Vitamin B-12: 483 pg/mL (ref 211–911)

## 2024-08-08 LAB — VITAMIN D 25 HYDROXY (VIT D DEFICIENCY, FRACTURES): VITD: 32.41 ng/mL (ref 30.00–100.00)

## 2024-08-08 LAB — TSH: TSH: 1.99 u[IU]/mL (ref 0.35–5.50)

## 2024-08-08 MED ORDER — OMEPRAZOLE 20 MG PO CPDR: 20.0000 mg | DELAYED_RELEASE_CAPSULE | Freq: Every day | ORAL | 1 refills | Status: AC

## 2024-08-08 NOTE — Progress Notes (Signed)
 Established Patient Office Visit     CC/Reason for Visit: Annual preventive exam  HPI: Virginia [name] is a 63 y.o. female who is coming in today for the above mentioned reasons. Past Medical History is significant for: ADHD, anxiety and depression followed by psychiatry, vitamin D  and B12 deficiencies, hyperlipidemia, history of colon polyps now due for repeat screening colonoscopy and GERD.  She is requesting a prescription for omeprazole .  Has routine eye care, has no natural teeth.  Is due for COVID-vaccine.  Recently had her lung CT and is awaiting results.   Past Medical/Surgical History: Past Medical History:  Diagnosis Date   ADHD    Allergy N/a   Anxiety    Arthritis    Back pain    Chest pain, unspecified    COPD (chronic obstructive pulmonary disease) (HCC) N/a   Depression    Dermatitis    Emphysema of lung (HCC) N/a   Fatty liver    GERD (gastroesophageal reflux disease)    HLD (hyperlipidemia)    no current med.   Insomnia    Joint pain    Loss of hair    Low back pain    Major depressive disorder, single episode, mild    Personal history of colonic polyps    Shoulder impingement 06/2012   left   Vitamin D  deficiency    Wears dentures    upper denture   Wears partial dentures    lower partial    Past Surgical History:  Procedure Laterality Date   ABDOMINAL HYSTERECTOMY  1990s   partial   APPENDECTOMY  06/08/2006   CARPAL TUNNEL RELEASE  01/14/2005   left; with left cubital tunnel release   EXPLORATORY LAPAROTOMY WITH ABDOMINAL MASS EXCISION  06/08/2006   resection right ovarian mass, partial omentectomy   FULKERSON SLIDE  10/19/2007   right knee   JOINT REPLACEMENT  04/30/2013   KNEE ARTHROSCOPY  05/28/2010   left   KNEE ARTHROSCOPY     right   SALPINGOOPHORECTOMY  06/08/2006   bilat.   TOTAL KNEE ARTHROPLASTY  05/04/2011   left    Social History:  reports that she quit smoking about 7 years ago. Her smoking use included cigarettes  and e-cigarettes. She started smoking about 47 years ago. She has a 119.9 pack-year smoking history. She has never used smokeless tobacco. She reports current alcohol use of about 1.0 standard drink of alcohol per week. She reports that she does not use drugs.  Allergies: Allergies  Allergen Reactions   Doxycycline Itching   Penicillins Hives and Swelling    SWELLING OF TONGUE SWELLING OF JOINTS   Rosuvastatin Other (See Comments)    MUSCLE ACHES    Family History:  Family History  Problem Relation Age of Onset   Hyperlipidemia Mother    COPD Mother    Heart disease Mother    Diabetes Mother    Depression Mother    Heart Problems Mother    Anxiety disorder Mother    Hypertension Father    Hypothyroidism Father    ADD / ADHD Father    Arthritis Father    Varicose Veins Father    Heart attack Other    Hypertension Other    Diabetes Other    COPD Other    Breast cancer Neg Hx    BRCA 1/2 Neg Hx      Current Outpatient Medications:    albuterol  (VENTOLIN  HFA) 108 (90 Base) MCG/ACT inhaler, INHALE 2  PUFFS INTO THE LUNGS EVERY 6 HOURS AS NEEDED FOR WHEEZING OR SHORTNESS OF BREATH, Disp: 6.7 g, Rfl: 1   atomoxetine (STRATTERA) 25 MG capsule, Take 50 mg by mouth daily., Disp: , Rfl:    atorvastatin  (LIPITOR) 40 MG tablet, TAKE 1 TABLET BY MOUTH ONCE DAILY ON  MONDAYS  THROUGH  FRIDAYS, Disp: 90 tablet, Rfl: 2   b complex vitamins capsule, Take 1 capsule by mouth daily., Disp: , Rfl:    desvenlafaxine (PRISTIQ) 100 MG 24 hr tablet, Take 1 tablet by mouth daily., Disp: , Rfl:    Eszopiclone 3 MG TABS, Take 3 mg by mouth., Disp: , Rfl:    omeprazole  (PRILOSEC) 20 MG capsule, Take 1 capsule (20 mg total) by mouth daily., Disp: 90 capsule, Rfl: 1   Vitamin D , Ergocalciferol , (DRISDOL ) 1.25 MG (50000 UNIT) CAPS capsule, TAKE ONE CAPSULE BY MOUTH EVERY 7 DAYS, Disp: 15 capsule, Rfl: 3  Review of Systems:  Negative unless indicated in HPI.   Physical Exam: Vitals:   08/08/24 1022   BP: 130/80  Pulse: 80  Temp: 97.8 F (36.6 C)  TempSrc: Oral  SpO2: 98%  Weight: 196 lb 9.6 oz (89.2 kg)  Height: 5' 3 (1.6 m)    Body mass index is 34.83 kg/m.   Physical Exam Vitals reviewed.  Constitutional:      General: She is not in acute distress.    Appearance: Normal appearance. She is obese. She is not ill-appearing, toxic-appearing or diaphoretic.  HENT:     Head: Normocephalic.     Right Ear: Tympanic membrane, ear canal and external ear normal. There is no impacted cerumen.     Left Ear: Tympanic membrane, ear canal and external ear normal. There is no impacted cerumen.     Nose: Nose normal.     Mouth/Throat:     Mouth: Mucous membranes are moist.     Pharynx: Oropharynx is clear. No oropharyngeal exudate or posterior oropharyngeal erythema.  Eyes:     General: No scleral icterus.       Right eye: No discharge.        Left eye: No discharge.     Conjunctiva/sclera: Conjunctivae normal.     Pupils: Pupils are equal, round, and reactive to light.  Neck:     Vascular: No carotid bruit.  Cardiovascular:     Rate and Rhythm: Normal rate and regular rhythm.     Pulses: Normal pulses.     Heart sounds: Normal heart sounds.  Pulmonary:     Effort: Pulmonary effort is normal. No respiratory distress.     Breath sounds: Normal breath sounds.  Abdominal:     General: Abdomen is flat. Bowel sounds are normal.     Palpations: Abdomen is soft.  Musculoskeletal:        General: Normal range of motion.     Cervical back: Normal range of motion.  Skin:    General: Skin is warm and dry.  Neurological:     General: No focal deficit present.     Mental Status: She is alert and oriented to person, place, and time. Mental status is at baseline.  Psychiatric:        Mood and Affect: Mood normal.        Behavior: Behavior normal.        Thought Content: Thought content normal.        Judgment: Judgment normal.     Flowsheet Row Office Visit from 07/24/2024 in [place]  [facility] Total Score 16     Impression and Plan:  Encounter for preventive health examination  Obesity (BMI 30.0-34.9) -     CBC with Differential/Platelet; Future -     Comprehensive metabolic panel with GFR; Future -     TSH; Future  Vitamin D  deficiency -     VITAMIN D  25 Hydroxy (Vit-D Deficiency, Fractures); Future  Other hyperlipidemia -     Lipid panel; Future  Low vitamin B12 level -     Vitamin B12; Future  Screening for malignant neoplasm of colon -     Ambulatory referral to Gastroenterology  Gastroesophageal reflux disease, unspecified whether esophagitis present -     Omeprazole ; Take 1 capsule (20 mg total) by mouth daily.  Dispense: 90 capsule; Refill: 1   -Recommend routine eye and dental care. -Healthy lifestyle discussed in detail. -Labs to be updated today. -Prostate cancer screening: N/A Health Maintenance  Topic Date Due   Colon Cancer Screening  12/27/2023   COVID-19 Vaccine (7 - Pfizer risk 2024-25 season) 07/17/2024   Medicare Annual Wellness Visit  10/19/2024   Screening for Lung Cancer  07/10/2025   Breast Cancer Screening  07/21/2026   DTaP/Tdap/Td vaccine (3 - Td or Tdap) 01/30/2032   Pneumococcal Vaccine for age over 44  Completed   Flu Shot  Completed   Hepatitis C Screening  Completed   HIV Screening  Completed   Zoster (Shingles) Vaccine  Completed   Hepatitis B Vaccine  Aged Out   HPV Vaccine  Aged Out   Meningitis B Vaccine  Aged Out     - Referral for GI placed. - Will obtain COVID-vaccine at pharmacy. - Start omeprazole  for GERD.     Tully Theophilus Andrews, MD [facility]

## 2024-08-09 ENCOUNTER — Ambulatory Visit: Payer: Self-pay | Admitting: Internal Medicine

## 2024-08-22 DIAGNOSIS — H31003 Unspecified chorioretinal scars, bilateral: Secondary | ICD-10-CM | POA: Diagnosis not present

## 2024-08-22 DIAGNOSIS — H52203 Unspecified astigmatism, bilateral: Secondary | ICD-10-CM | POA: Diagnosis not present

## 2024-09-13 ENCOUNTER — Other Ambulatory Visit: Payer: Self-pay | Admitting: Medical Genetics

## 2024-09-13 DIAGNOSIS — Z006 Encounter for examination for normal comparison and control in clinical research program: Secondary | ICD-10-CM

## 2024-10-01 ENCOUNTER — Other Ambulatory Visit: Payer: Self-pay | Admitting: Internal Medicine

## 2024-10-01 DIAGNOSIS — E7800 Pure hypercholesterolemia, unspecified: Secondary | ICD-10-CM

## 2024-11-01 ENCOUNTER — Ambulatory Visit: Payer: Medicare Other

## 2024-11-01 ENCOUNTER — Encounter: Payer: Self-pay | Admitting: Internal Medicine

## 2024-11-14 ENCOUNTER — Ambulatory Visit: Admitting: Family Medicine

## 2025-02-05 ENCOUNTER — Ambulatory Visit: Admitting: Internal Medicine
